# Patient Record
Sex: Male | Born: 1975 | Race: White | Hispanic: No | Marital: Married | Smoking: Current some day smoker
Health system: Southern US, Community
[De-identification: ages and names within clinical notes are randomized; demographics above are authoritative.]

## PROBLEM LIST (undated history)

## (undated) DIAGNOSIS — N200 Calculus of kidney: Secondary | ICD-10-CM

## (undated) HISTORY — PX: KNEE ARTHROSCOPY: SUR90

## (undated) HISTORY — PX: VARICOCELECTOMY: SHX1084

## (undated) HISTORY — PX: HEEL DECUBITUS ULCER EXCISION: SUR510

## (undated) HISTORY — PX: FOOT SURGERY: SHX648

---

## 2001-03-03 ENCOUNTER — Encounter: Payer: Self-pay | Admitting: Internal Medicine

## 2001-03-03 ENCOUNTER — Emergency Department (HOSPITAL_COMMUNITY): Admission: EM | Admit: 2001-03-03 | Discharge: 2001-03-03 | Payer: Self-pay | Admitting: Internal Medicine

## 2003-06-18 ENCOUNTER — Encounter (HOSPITAL_COMMUNITY): Admission: RE | Admit: 2003-06-18 | Discharge: 2003-07-18 | Payer: Self-pay | Admitting: Oncology

## 2003-06-18 ENCOUNTER — Encounter: Admission: RE | Admit: 2003-06-18 | Discharge: 2003-06-18 | Payer: Self-pay | Admitting: Oncology

## 2003-08-18 ENCOUNTER — Encounter (HOSPITAL_COMMUNITY): Admission: RE | Admit: 2003-08-18 | Discharge: 2003-09-17 | Payer: Self-pay | Admitting: Oncology

## 2003-08-18 ENCOUNTER — Encounter: Admission: RE | Admit: 2003-08-18 | Discharge: 2003-08-18 | Payer: Self-pay | Admitting: Oncology

## 2003-09-22 ENCOUNTER — Encounter: Admission: RE | Admit: 2003-09-22 | Discharge: 2003-09-22 | Payer: Self-pay | Admitting: Oncology

## 2003-09-22 ENCOUNTER — Encounter (HOSPITAL_COMMUNITY): Admission: RE | Admit: 2003-09-22 | Discharge: 2003-10-22 | Payer: Self-pay | Admitting: Oncology

## 2003-11-04 ENCOUNTER — Encounter: Admission: RE | Admit: 2003-11-04 | Discharge: 2003-11-04 | Payer: Self-pay | Admitting: Oncology

## 2003-11-04 ENCOUNTER — Encounter (HOSPITAL_COMMUNITY): Admission: RE | Admit: 2003-11-04 | Discharge: 2003-12-04 | Payer: Self-pay | Admitting: Oncology

## 2006-04-03 ENCOUNTER — Ambulatory Visit (HOSPITAL_COMMUNITY): Admission: RE | Admit: 2006-04-03 | Discharge: 2006-04-03 | Payer: Self-pay | Admitting: Sports Medicine

## 2007-04-19 ENCOUNTER — Emergency Department (HOSPITAL_COMMUNITY): Admission: EM | Admit: 2007-04-19 | Discharge: 2007-04-19 | Payer: Self-pay | Admitting: Emergency Medicine

## 2010-09-13 ENCOUNTER — Encounter: Payer: Self-pay | Admitting: Orthopaedic Surgery

## 2013-06-10 ENCOUNTER — Ambulatory Visit (HOSPITAL_COMMUNITY)
Admission: RE | Admit: 2013-06-10 | Discharge: 2013-06-10 | Disposition: A | Discharge status: Home / Self Care | Payer: BC Managed Care – PPO | Source: Ambulatory Visit | Attending: Family Medicine | Admitting: Family Medicine

## 2013-06-10 ENCOUNTER — Other Ambulatory Visit (HOSPITAL_COMMUNITY): Payer: Self-pay | Admitting: Family Medicine

## 2013-06-10 DIAGNOSIS — M79609 Pain in unspecified limb: Secondary | ICD-10-CM

## 2015-12-07 ENCOUNTER — Ambulatory Visit (INDEPENDENT_AMBULATORY_CARE_PROVIDER_SITE_OTHER): Payer: BLUE CROSS/BLUE SHIELD | Admitting: Family Medicine

## 2015-12-07 ENCOUNTER — Encounter: Payer: Self-pay | Admitting: Family Medicine

## 2015-12-07 VITALS — BP 120/60 | HR 74 | Temp 97.6°F | Resp 14 | Ht 71.0 in | Wt 156.0 lb

## 2015-12-07 DIAGNOSIS — Z0000 Encounter for general adult medical examination without abnormal findings: Secondary | ICD-10-CM

## 2015-12-07 NOTE — Progress Notes (Signed)
Subjective:    Patient ID: Wesley Middleton, male    DOB: 11/03/1975, 40 y.o.   MRN: [id number]  HPI The patient is here today to establish care. He has no medical concerns. He states that overall his health is good. He smokes 1 cigarette a day. However he does chew tobacco and dips snuff. Family history is significant for mother who died from colon cancer at 565. He believes she was diagnosed at 5262. He denies any blood in his stool. He denies any black tarry stool. He denies any abdominal pain. He denies any family history of prostate cancer No past medical history on file. Past Surgical History  Procedure Laterality Date  . Varicocelectomy      left testicle   No current outpatient prescriptions on file prior to visit.   No current facility-administered medications on file prior to visit.   Allergies  Allergen Reactions  . Ceclor [Cefaclor] Other (See Comments)    Pt had reaction as a child  . Penicillins Other (See Comments)    Had reaction as child   Social History   Social History  . Marital Status: Married    Spouse Name: N/A  . Number of Children: N/A  . Years of Education: N/A   Occupational History  . Not on file.   Social History Main Topics  . Smoking status: Current Some Day Smoker    Types: Cigarettes  . Smokeless tobacco: Current User    Types: Snuff  . Alcohol Use: 0.0 oz/week    0 Standard drinks or equivalent per week     Comment: Occasionally  . Drug Use: No  . Sexual Activity: Not on file   Other Topics Concern  . Not on file   Social History Narrative  . No narrative on file   Family History  Problem Relation Age of Onset  . Cancer Mother     colon (65)  . Stroke Paternal Grandfather       Review of Systems  All other systems reviewed and are negative.      Objective:   Physical Exam  Constitutional: He is oriented to person, place, and time. He appears well-developed and well-nourished. No distress.  HENT:  Head:  Normocephalic and atraumatic.  Right Ear: External ear normal.  Nose: Nose normal.  Mouth/Throat: Oropharynx is clear and moist. No oropharyngeal exudate.  Eyes: Conjunctivae and EOM are normal. Pupils are equal, round, and reactive to light. Right eye exhibits no discharge. Left eye exhibits no discharge. No scleral icterus.  Neck: Normal range of motion. Neck supple. No JVD present. No tracheal deviation present. No thyromegaly present.  Cardiovascular: Normal rate, regular rhythm, normal heart sounds and intact distal pulses.  Exam reveals no gallop and no friction rub.   No murmur heard. Pulmonary/Chest: Effort normal and breath sounds normal. No stridor. No respiratory distress. He has no wheezes. He has no rales. He exhibits no tenderness.  Abdominal: Soft. Bowel sounds are normal. He exhibits no distension and no mass. There is no tenderness. There is no rebound and no guarding. Hernia confirmed negative in the right inguinal area and confirmed negative in the left inguinal area.  Genitourinary: Testes normal and penis normal. Right testis shows no mass and no tenderness. Left testis shows no mass and no tenderness.  Musculoskeletal: Normal range of motion. He exhibits no edema or tenderness.  Lymphadenopathy:    He has no cervical adenopathy.       Right: No inguinal adenopathy  present.       Left: No inguinal adenopathy present.  Neurological: He is alert and oriented to person, place, and time. He has normal reflexes. He displays normal reflexes. No cranial nerve deficit. He exhibits normal muscle tone. Coordination normal.  Skin: Skin is warm. No rash noted. He is not diaphoretic. No erythema. No pallor.  Psychiatric: He has a normal mood and affect. His behavior is normal. Judgment and thought content normal.  Vitals reviewed.         Assessment & Plan:  Routine general medical examination at a health care facility - Plan: CBC with Differential/Platelet, COMPLETE METABOLIC  PANEL WITH GFR, Lipid panel  Exam today is normal. I recommended smoking cessation and avoidance of oral tobacco. I would like him to return fasting for a CBC, CMP, fasting lipid panel. He'll be due for colonoscopy 10 years before his family member was diagnosed or age 68 which ever comes first. He is not yet due for prostate cancer screening. His blood pressures excellent. Tetanus shot is up-to-date.

## 2015-12-08 ENCOUNTER — Other Ambulatory Visit: Payer: BLUE CROSS/BLUE SHIELD

## 2015-12-08 DIAGNOSIS — Z0000 Encounter for general adult medical examination without abnormal findings: Secondary | ICD-10-CM | POA: Diagnosis not present

## 2015-12-08 LAB — COMPLETE METABOLIC PANEL WITH GFR
ALBUMIN: 4.1 g/dL (ref 3.6–5.1)
ALK PHOS: 57 U/L (ref 40–115)
ALT: 13 U/L (ref 9–46)
AST: 17 U/L (ref 10–40)
BILIRUBIN TOTAL: 0.4 mg/dL (ref 0.2–1.2)
BUN: 12 mg/dL (ref 7–25)
CO2: 24 mmol/L (ref 20–31)
Calcium: 9.1 mg/dL (ref 8.6–10.3)
Chloride: 104 mmol/L (ref 98–110)
Creat: 0.9 mg/dL (ref 0.60–1.35)
GLUCOSE: 89 mg/dL (ref 70–99)
POTASSIUM: 4.3 mmol/L (ref 3.5–5.3)
SODIUM: 140 mmol/L (ref 135–146)
TOTAL PROTEIN: 6.5 g/dL (ref 6.1–8.1)

## 2015-12-08 LAB — LIPID PANEL
CHOL/HDL RATIO: 2.6 ratio (ref <=5.0)
Cholesterol: 129 mg/dL (ref 125–200)
HDL: 50 mg/dL (ref >=40)
LDL Cholesterol: 70 mg/dL (ref <130)
TRIGLYCERIDES: 46 mg/dL (ref <150)
VLDL: 9 mg/dL (ref <30)

## 2015-12-08 LAB — CBC WITH DIFFERENTIAL/PLATELET
Basophils Absolute: 54 cells/uL (ref 0–200)
Basophils Relative: 1 %
Eosinophils Absolute: 108 cells/uL (ref 15–500)
Eosinophils Relative: 2 %
HCT: 44.6 % (ref 38.5–50.0)
Hemoglobin: 14.6 g/dL (ref 13.0–17.0)
Lymphocytes Relative: 36 %
Lymphs Abs: 1944 cells/uL (ref 850–3900)
MCH: 30.3 pg (ref 27.0–33.0)
MCHC: 32.7 g/dL (ref 32.0–36.0)
MCV: 92.5 fL (ref 80.0–100.0)
Monocytes Absolute: 594 cells/uL (ref 200–950)
Monocytes Relative: 11 %
Neutro Abs: 2700 cells/uL (ref 1500–7800)
Neutrophils Relative %: 50 %
Platelets: 143 10*3/uL (ref 140–400)
RBC: 4.82 MIL/uL (ref 4.20–5.80)
RDW: 13.6 % (ref 11.0–15.0)
WBC: 5.4 10*3/uL (ref 3.8–10.8)

## 2015-12-10 ENCOUNTER — Encounter: Payer: Self-pay | Admitting: *Deleted

## 2015-12-15 ENCOUNTER — Encounter: Payer: Self-pay | Admitting: Family Medicine

## 2016-01-19 ENCOUNTER — Ambulatory Visit (INDEPENDENT_AMBULATORY_CARE_PROVIDER_SITE_OTHER): Payer: BLUE CROSS/BLUE SHIELD | Admitting: Family Medicine

## 2016-01-19 ENCOUNTER — Encounter: Payer: Self-pay | Admitting: Family Medicine

## 2016-01-19 VITALS — BP 100/70 | HR 72 | Temp 97.7°F | Resp 18 | Ht 71.0 in | Wt 158.0 lb

## 2016-01-19 DIAGNOSIS — A6920 Lyme disease, unspecified: Secondary | ICD-10-CM

## 2016-01-19 MED ORDER — DOXYCYCLINE HYCLATE 100 MG PO TABS: 100.0000 mg | ORAL_TABLET | Freq: Two times a day (BID) | ORAL | Status: DC

## 2016-01-19 NOTE — Progress Notes (Signed)
   Subjective:    Patient ID: Wesley Middleton, male    DOB: 05/24/1976, 40 y.o.   MRN: [id number]  HPI 7-10 days ago, the patient found a tick feeding on his skin over the left inguinal canal. Tick was removed but it had been attached for at least a day. Patient gave no further thought.  However over the weekend, he started developing a spreading red ring with central clearing. The ring is approximately 8 cm x 6 cm. The center is clear with tick had bitten him. It appears to be erythema migrans. Otherwise he is asymptomatic No past medical history on file. Past Surgical History  Procedure Laterality Date  . Varicocelectomy      left testicle   No current outpatient prescriptions on file prior to visit.   No current facility-administered medications on file prior to visit.   Allergies  Allergen Reactions  . Ceclor [Cefaclor] Other (See Comments)    Pt had reaction as a child  . Penicillins Other (See Comments)    Had reaction as child   Social History   Social History  . Marital Status: Married    Spouse Name: N/A  . Number of Children: N/A  . Years of Education: N/A   Occupational History  . Not on file.   Social History Main Topics  . Smoking status: Current Some Day Smoker    Types: Cigarettes  . Smokeless tobacco: Current User    Types: Snuff  . Alcohol Use: 0.0 oz/week    0 Standard drinks or equivalent per week     Comment: Occasionally  . Drug Use: No  . Sexual Activity: Not on file   Other Topics Concern  . Not on file   Social History Narrative      Review of Systems  All other systems reviewed and are negative.      Objective:   Physical Exam  Cardiovascular: Normal rate, regular rhythm and normal heart sounds.   Pulmonary/Chest: Effort normal and breath sounds normal. No respiratory distress. He has no wheezes. He has no rales.  Skin: Rash noted. There is erythema.  Vitals reviewed.         Assessment & Plan:  Erythema migrans (Lyme  disease) - Plan: doxycycline (VIBRA-TABS) 100 MG tablet  Begin doxycycline 100 mg by mouth twice a day for 21 days. Given the fact that he has had the condition for at least 7-10 days down the side of caution and treat longer than 14 days to ensure complete resolution.

## 2016-01-28 ENCOUNTER — Telehealth: Payer: Self-pay | Admitting: Family Medicine

## 2016-01-28 MED ORDER — PANTOPRAZOLE SODIUM 40 MG PO TBEC: 40.0000 mg | DELAYED_RELEASE_TABLET | Freq: Every day | ORAL | Status: DC

## 2016-01-28 NOTE — Telephone Encounter (Signed)
Start protonix 40 mg poqday and call me if no better.

## 2016-01-28 NOTE — Telephone Encounter (Signed)
Pt's wife called and said that he has been having severe heartburn over the past 2 days. He has not been able to eat much of anything due to the pain. He believes that it may be coming from the Doxycycline. He is requesting advice on anything he may be able to take to help relieve it.  [phone]

## 2016-01-28 NOTE — Telephone Encounter (Signed)
Call placed to patient and patient made aware.   Prescription sent to pharmacy.  

## 2016-03-24 ENCOUNTER — Other Ambulatory Visit: Payer: Self-pay | Admitting: Family Medicine

## 2016-03-24 ENCOUNTER — Other Ambulatory Visit: Payer: Self-pay | Admitting: *Deleted

## 2016-03-24 ENCOUNTER — Ambulatory Visit (HOSPITAL_COMMUNITY)
Admission: RE | Admit: 2016-03-24 | Discharge: 2016-03-24 | Disposition: A | Discharge status: Home / Self Care | Payer: BLUE CROSS/BLUE SHIELD | Source: Ambulatory Visit | Attending: Family Medicine | Admitting: Family Medicine

## 2016-03-24 ENCOUNTER — Ambulatory Visit (INDEPENDENT_AMBULATORY_CARE_PROVIDER_SITE_OTHER): Payer: BLUE CROSS/BLUE SHIELD | Admitting: Family Medicine

## 2016-03-24 ENCOUNTER — Encounter: Payer: Self-pay | Admitting: Family Medicine

## 2016-03-24 VITALS — BP 110/80 | HR 62 | Temp 97.6°F | Resp 14 | Ht 71.0 in | Wt 157.0 lb

## 2016-03-24 DIAGNOSIS — M25469 Effusion, unspecified knee: Secondary | ICD-10-CM

## 2016-03-24 DIAGNOSIS — M25562 Pain in left knee: Secondary | ICD-10-CM

## 2016-03-24 DIAGNOSIS — M1712 Unilateral primary osteoarthritis, left knee: Secondary | ICD-10-CM | POA: Insufficient documentation

## 2016-03-24 DIAGNOSIS — M179 Osteoarthritis of knee, unspecified: Secondary | ICD-10-CM | POA: Diagnosis not present

## 2016-03-24 NOTE — Progress Notes (Signed)
   Subjective:    Patient ID: Wesley Middleton, male    DOB: [birth year], 40 y.o.   MRN: [id number]  HPI  Has been Dealing with left knee pain, posterior on and off for more than a year. He has hurt the knee twice going up but he never had any testing done to figure out what the injury was. Now he reports periodic knee effusions. In the past doctors have "drawn fluid off of that knee" on 2 separate occasions. He reports sharp pain in the posterior aspect of the knee joint. He reports episodic tightness and swelling that limit his flexion in the knee joint. After he is off his knee for a few days the pain goes away but after working on his knee for 1 or 2 days the pain returns. He is tried over-the-counter NSAIDs with minimal relief No past medical history on file. Past Surgical History:  Procedure Laterality Date  . VARICOCELECTOMY     left testicle   Current Outpatient Prescriptions on File Prior to Visit  Medication Sig Dispense Refill  . doxycycline (VIBRA-TABS) 100 MG tablet Take 1 tablet (100 mg total) by mouth 2 (two) times daily. 42 tablet 0  . pantoprazole (PROTONIX) 40 MG tablet Take 1 tablet (40 mg total) by mouth daily. 30 tablet 3   No current facility-administered medications on file prior to visit.    Allergies  Allergen Reactions  . Ceclor [Cefaclor] Other (See Comments)    Pt had reaction as a child  . Penicillins Other (See Comments)    Had reaction as child   Social History   Social History  . Marital status: Married    Spouse name: N/A  . Number of children: N/A  . Years of education: N/A   Occupational History  . Not on file.   Social History Main Topics  . Smoking status: Current Some Day Smoker    Types: Cigarettes  . Smokeless tobacco: Current User    Types: Snuff  . Alcohol use 0.0 oz/week     Comment: Occasionally  . Drug use: No  . Sexual activity: Not on file   Other Topics Concern  . Not on file   Social History Narrative  . No  narrative on file   Family History  Problem Relation Age of Onset  . Cancer Mother     colon (65)  . Stroke Paternal Grandfather       Review of Systems  All other systems reviewed and are negative.      Objective:   Physical Exam  Cardiovascular: Normal rate, regular rhythm and normal heart sounds.   Pulmonary/Chest: Effort normal and breath sounds normal.  Musculoskeletal:       Left knee: He exhibits normal range of motion, no swelling, no effusion and normal meniscus. No tenderness found. No MCL and no LCL tenderness noted.  Vitals reviewed.         Assessment & Plan:  Posterior left knee pain - Plan: DG Knee Complete 4 Views Left  I suspect the patient has suffered an meniscus tear in the past and may be dealing with a Baker's cyst from time to time area begin x-ray of the knee. If x-ray is normal as I suspect, proceed with an MRI to determine if surgical intervention is necessary

## 2016-04-11 ENCOUNTER — Ambulatory Visit (HOSPITAL_COMMUNITY): Payer: BLUE CROSS/BLUE SHIELD

## 2016-05-23 ENCOUNTER — Telehealth: Payer: Self-pay | Admitting: Family Medicine

## 2016-05-23 ENCOUNTER — Other Ambulatory Visit: Payer: Self-pay | Admitting: Family Medicine

## 2016-05-23 ENCOUNTER — Encounter: Payer: Self-pay | Admitting: Family Medicine

## 2016-05-23 DIAGNOSIS — M25562 Pain in left knee: Secondary | ICD-10-CM

## 2016-05-23 NOTE — Telephone Encounter (Signed)
Pt has MRI of knee in August approved and scheduled.  He never had done due to deductible.  Now wants us to reschedule.  Insurance is denying.  Per PCP refer to Ortho for eval.  Pt requests to see [facility].

## 2016-05-24 DIAGNOSIS — M25562 Pain in left knee: Secondary | ICD-10-CM | POA: Diagnosis not present

## 2016-06-01 DIAGNOSIS — M25562 Pain in left knee: Secondary | ICD-10-CM | POA: Diagnosis not present

## 2016-06-07 DIAGNOSIS — M25562 Pain in left knee: Secondary | ICD-10-CM | POA: Diagnosis not present

## 2016-06-22 DIAGNOSIS — G8918 Other acute postprocedural pain: Secondary | ICD-10-CM | POA: Diagnosis not present

## 2016-06-22 DIAGNOSIS — S83512A Sprain of anterior cruciate ligament of left knee, initial encounter: Secondary | ICD-10-CM | POA: Diagnosis not present

## 2016-06-22 DIAGNOSIS — M23252 Derangement of posterior horn of lateral meniscus due to old tear or injury, left knee: Secondary | ICD-10-CM | POA: Diagnosis not present

## 2016-06-22 DIAGNOSIS — S83242A Other tear of medial meniscus, current injury, left knee, initial encounter: Secondary | ICD-10-CM | POA: Diagnosis not present

## 2016-06-30 DIAGNOSIS — S83242D Other tear of medial meniscus, current injury, left knee, subsequent encounter: Secondary | ICD-10-CM | POA: Diagnosis not present

## 2016-07-11 DIAGNOSIS — S83242D Other tear of medial meniscus, current injury, left knee, subsequent encounter: Secondary | ICD-10-CM | POA: Diagnosis not present

## 2016-08-10 DIAGNOSIS — M545 Low back pain: Secondary | ICD-10-CM | POA: Diagnosis not present

## 2016-08-10 DIAGNOSIS — M9902 Segmental and somatic dysfunction of thoracic region: Secondary | ICD-10-CM | POA: Diagnosis not present

## 2016-08-10 DIAGNOSIS — M9903 Segmental and somatic dysfunction of lumbar region: Secondary | ICD-10-CM | POA: Diagnosis not present

## 2016-08-10 DIAGNOSIS — M546 Pain in thoracic spine: Secondary | ICD-10-CM | POA: Diagnosis not present

## 2016-08-16 DIAGNOSIS — S83242D Other tear of medial meniscus, current injury, left knee, subsequent encounter: Secondary | ICD-10-CM | POA: Diagnosis not present

## 2016-08-17 ENCOUNTER — Encounter: Payer: Self-pay | Admitting: Family Medicine

## 2016-08-17 ENCOUNTER — Ambulatory Visit (INDEPENDENT_AMBULATORY_CARE_PROVIDER_SITE_OTHER): Payer: BLUE CROSS/BLUE SHIELD | Admitting: Family Medicine

## 2016-08-17 VITALS — BP 118/70 | HR 80 | Temp 98.6°F | Resp 16 | Ht 71.0 in | Wt 163.0 lb

## 2016-08-17 DIAGNOSIS — J111 Influenza due to unidentified influenza virus with other respiratory manifestations: Secondary | ICD-10-CM

## 2016-08-17 MED ORDER — OSELTAMIVIR PHOSPHATE 75 MG PO CAPS
75.0000 mg | ORAL_CAPSULE | Freq: Two times a day (BID) | ORAL | 0 refills | Status: DC
Start: 2016-08-17 — End: 2017-10-13

## 2016-08-17 NOTE — Progress Notes (Signed)
   Subjective:    Patient ID: Wesley Middleton, male    DOB: 07/31/1976, 40 y.o.   MRN: [id number]  HPI Symptoms began 2 days ago. Symptoms consist of diffuse severe body aches, low-grade fever to 101, rhinorrhea, scratchy throat, dull headache, and nonproductive cough. No past medical history on file. Past Surgical History:  Procedure Laterality Date  . VARICOCELECTOMY     left testicle   No current outpatient prescriptions on file prior to visit.   No current facility-administered medications on file prior to visit.    Allergies  Allergen Reactions  . Ceclor [Cefaclor] Other (See Comments)    Pt had reaction as a child  . Penicillins Other (See Comments)    Had reaction as child   Social History   Social History  . Marital status: Married    Spouse name: N/A  . Number of children: N/A  . Years of education: N/A   Occupational History  . Not on file.   Social History Main Topics  . Smoking status: Current Some Day Smoker    Types: Cigarettes  . Smokeless tobacco: Current User    Types: Snuff  . Alcohol use 0.0 oz/week     Comment: Occasionally  . Drug use: No  . Sexual activity: Not on file   Other Topics Concern  . Not on file   Social History Narrative  . No narrative on file      Review of Systems  All other systems reviewed and are negative.      Objective:   Physical Exam  Constitutional: He appears well-developed and well-nourished. No distress.  HENT:  Right Ear: External ear normal.  Left Ear: External ear normal.  Nose: Mucosal edema and rhinorrhea present.  Mouth/Throat: Oropharynx is clear and moist. No oropharyngeal exudate.  Eyes: Conjunctivae are normal.  Neck: Neck supple.  Cardiovascular: Normal rate, regular rhythm and normal heart sounds.   No murmur heard. Pulmonary/Chest: Effort normal and breath sounds normal. No respiratory distress. He has no wheezes. He has no rales.  Lymphadenopathy:    He has no cervical adenopathy.   Skin: He is not diaphoretic.  Vitals reviewed.         Assessment & Plan:  Influenza with respiratory manifestation - Plan: oseltamivir (TAMIFLU) 75 MG capsule  History and symptoms are consistent with influenza. Begin Tamiflu 75 mg by mouth twice a day for 5 days. Use ibuprofen for symptomatic relief. Recheck in one week if no better or sooner if worse

## 2016-08-24 DIAGNOSIS — M9902 Segmental and somatic dysfunction of thoracic region: Secondary | ICD-10-CM | POA: Diagnosis not present

## 2016-08-24 DIAGNOSIS — M545 Low back pain: Secondary | ICD-10-CM | POA: Diagnosis not present

## 2016-08-24 DIAGNOSIS — M546 Pain in thoracic spine: Secondary | ICD-10-CM | POA: Diagnosis not present

## 2016-08-24 DIAGNOSIS — M9903 Segmental and somatic dysfunction of lumbar region: Secondary | ICD-10-CM | POA: Diagnosis not present

## 2017-06-16 DIAGNOSIS — M9902 Segmental and somatic dysfunction of thoracic region: Secondary | ICD-10-CM | POA: Diagnosis not present

## 2017-06-16 DIAGNOSIS — M545 Low back pain: Secondary | ICD-10-CM | POA: Diagnosis not present

## 2017-06-16 DIAGNOSIS — M546 Pain in thoracic spine: Secondary | ICD-10-CM | POA: Diagnosis not present

## 2017-06-16 DIAGNOSIS — M9903 Segmental and somatic dysfunction of lumbar region: Secondary | ICD-10-CM | POA: Diagnosis not present

## 2017-09-22 DIAGNOSIS — M9902 Segmental and somatic dysfunction of thoracic region: Secondary | ICD-10-CM | POA: Diagnosis not present

## 2017-09-22 DIAGNOSIS — M9903 Segmental and somatic dysfunction of lumbar region: Secondary | ICD-10-CM | POA: Diagnosis not present

## 2017-09-22 DIAGNOSIS — M546 Pain in thoracic spine: Secondary | ICD-10-CM | POA: Diagnosis not present

## 2017-09-22 DIAGNOSIS — M545 Low back pain: Secondary | ICD-10-CM | POA: Diagnosis not present

## 2017-09-28 DIAGNOSIS — M9902 Segmental and somatic dysfunction of thoracic region: Secondary | ICD-10-CM | POA: Diagnosis not present

## 2017-09-28 DIAGNOSIS — M9903 Segmental and somatic dysfunction of lumbar region: Secondary | ICD-10-CM | POA: Diagnosis not present

## 2017-09-28 DIAGNOSIS — M545 Low back pain: Secondary | ICD-10-CM | POA: Diagnosis not present

## 2017-09-28 DIAGNOSIS — M546 Pain in thoracic spine: Secondary | ICD-10-CM | POA: Diagnosis not present

## 2017-10-13 ENCOUNTER — Encounter: Payer: Self-pay | Admitting: Family Medicine

## 2017-10-13 ENCOUNTER — Ambulatory Visit: Payer: BLUE CROSS/BLUE SHIELD | Admitting: Family Medicine

## 2017-10-13 VITALS — BP 132/78 | HR 58 | Temp 97.4°F | Resp 14 | Ht 71.0 in | Wt 161.0 lb

## 2017-10-13 DIAGNOSIS — S39012A Strain of muscle, fascia and tendon of lower back, initial encounter: Secondary | ICD-10-CM | POA: Diagnosis not present

## 2017-10-13 MED ORDER — MELOXICAM 15 MG PO TABS: 15.0000 mg | ORAL_TABLET | Freq: Every day | ORAL | 0 refills | Status: DC

## 2017-10-13 MED ORDER — PREDNISONE 20 MG PO TABS: ORAL_TABLET | ORAL | 0 refills | Status: DC

## 2017-10-13 MED ORDER — CYCLOBENZAPRINE HCL 10 MG PO TABS: 10.0000 mg | ORAL_TABLET | Freq: Three times a day (TID) | ORAL | 0 refills | Status: DC | PRN
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
## 2017-11-03 IMAGING — DX DG KNEE COMPLETE 4+V*L*
4 series · 4 of 4 positions shown · non-contrast
Comparison: None ; correlation MRI RIGHT knee 04/03/2006

CLINICAL DATA: Posterior LEFT knee pain for couple weeks, remote
injury, unable to bend knee all the way

EXAM:
LEFT KNEE - COMPLETE 4+ VIEW

[knee ap]
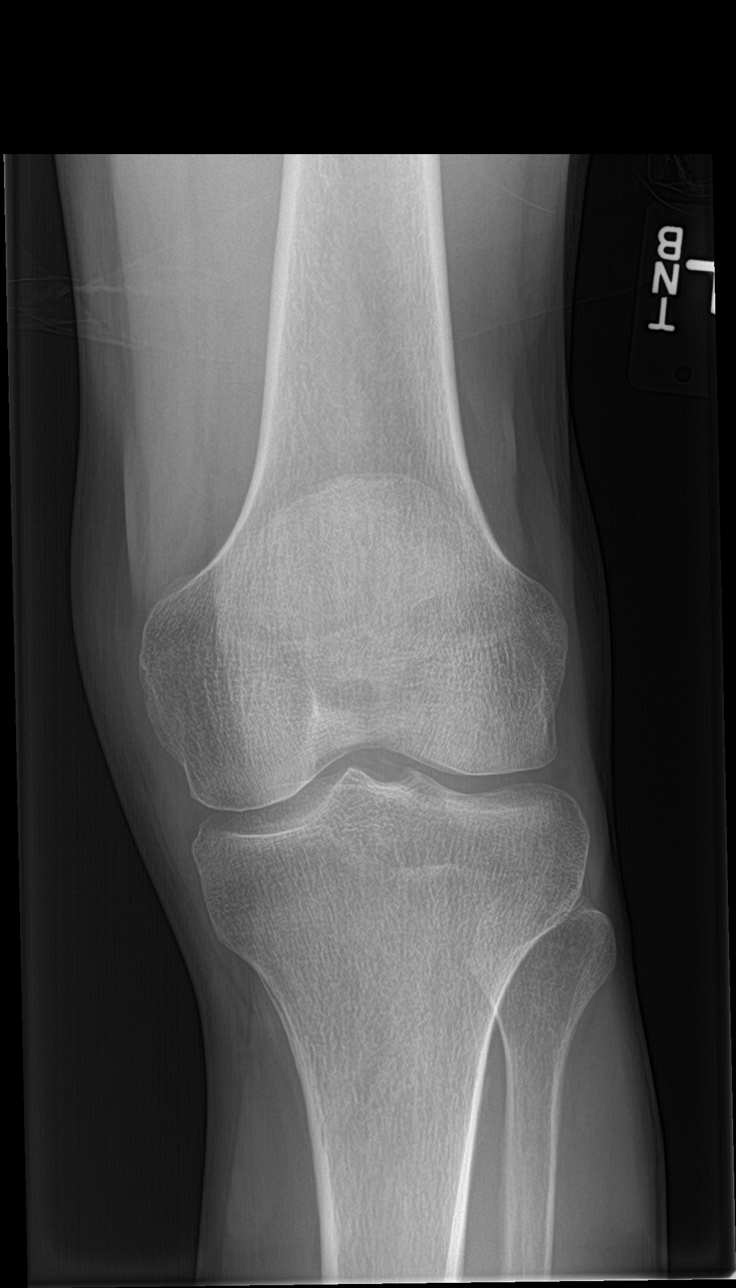

[tunnel]
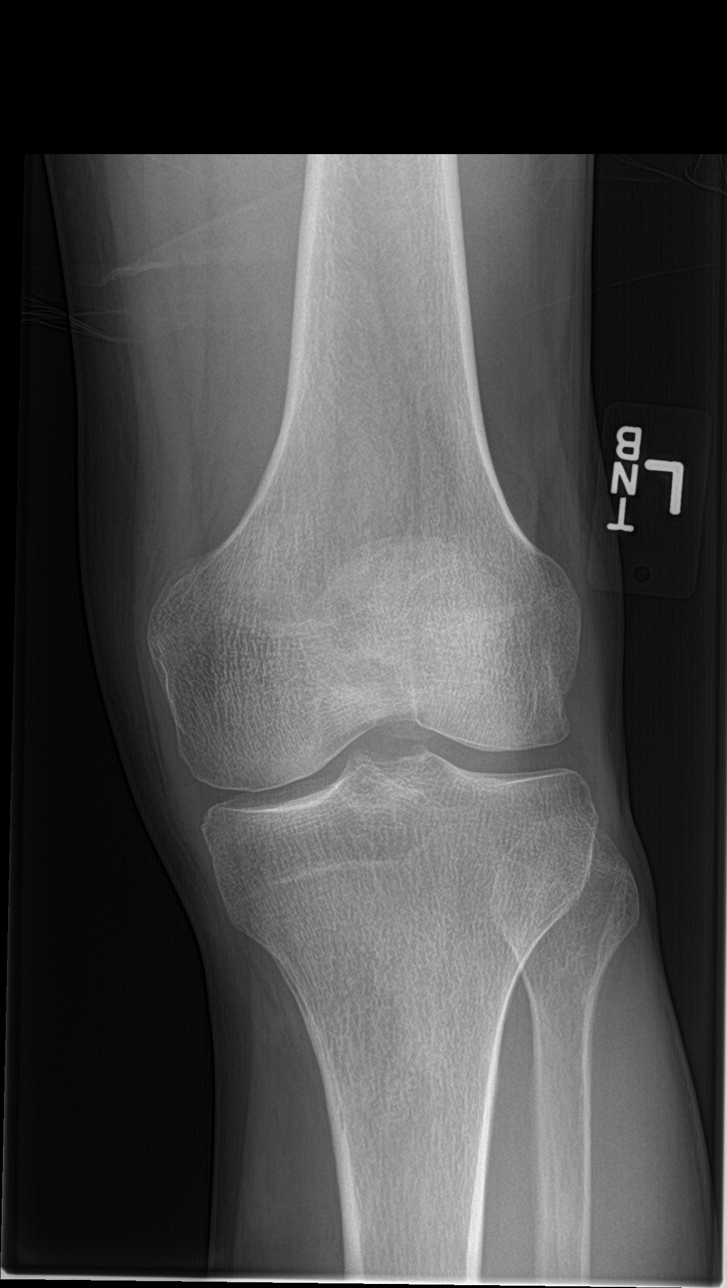

[knee lat]
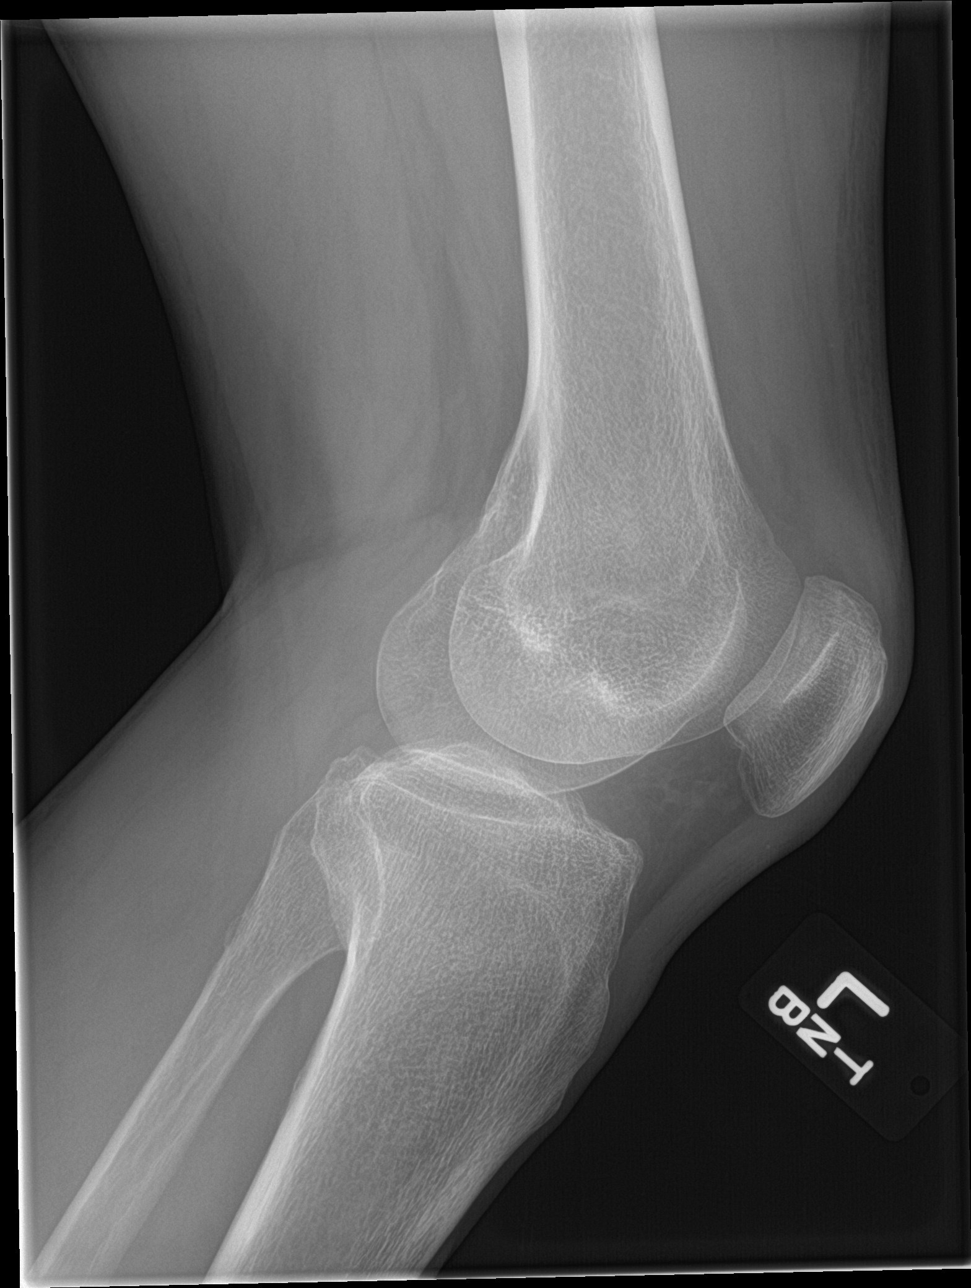

[knee sunrise]
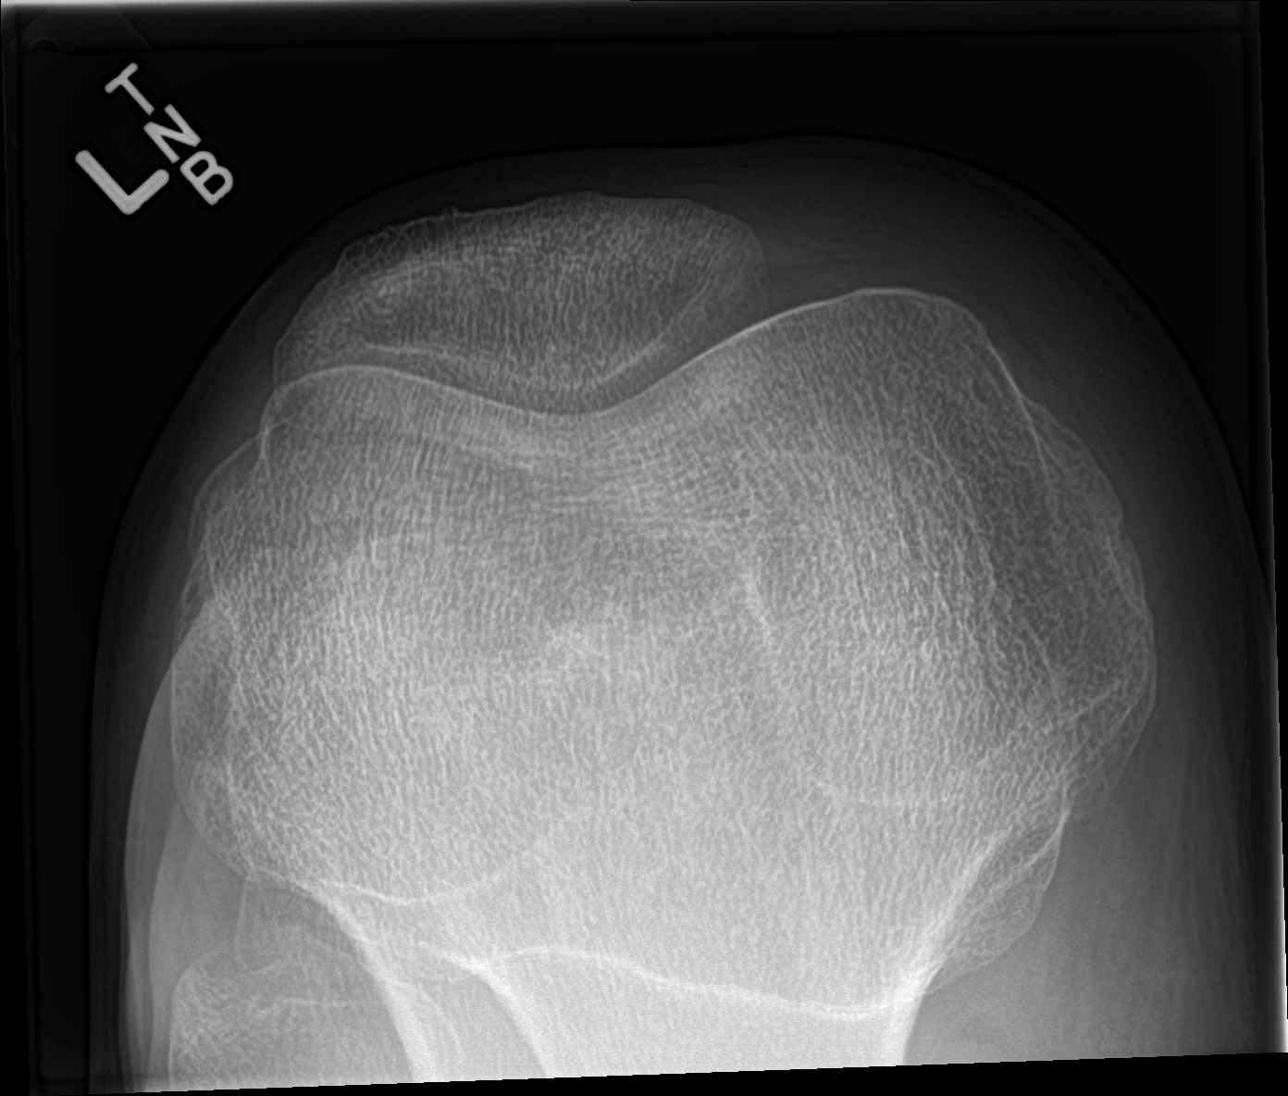

[4 of 4 positions shown; findings below may reference images not displayed]

FINDINGS: Osseous mineralization low normal for technique.

Mild medial compartment joint space narrowing.

Remaining joint spaces preserved.

No acute fracture, dislocation, or bone destruction.

No knee joint effusion.
IMPRESSION: Minimal degenerative changes without acute bony abnormality.

## 2018-04-09 ENCOUNTER — Encounter: Payer: Self-pay | Admitting: Family Medicine

## 2018-04-09 ENCOUNTER — Ambulatory Visit: Payer: BLUE CROSS/BLUE SHIELD | Admitting: Family Medicine

## 2018-04-09 VITALS — BP 110/68 | HR 62 | Temp 98.0°F | Resp 16 | Ht 71.0 in | Wt 158.0 lb

## 2018-04-09 DIAGNOSIS — B9789 Other viral agents as the cause of diseases classified elsewhere: Secondary | ICD-10-CM

## 2018-04-09 DIAGNOSIS — J069 Acute upper respiratory infection, unspecified: Secondary | ICD-10-CM

## 2018-04-09 MED ORDER — FLUTICASONE PROPIONATE 50 MCG/ACT NA SUSP: 2.0000 | Freq: Every day | NASAL | 6 refills | Status: DC

## 2018-04-09 MED ORDER — HYDROCODONE-HOMATROPINE 5-1.5 MG/5ML PO SYRP: 5.0000 mL | ORAL_SOLUTION | Freq: Three times a day (TID) | ORAL | 0 refills | Status: DC | PRN

## 2018-04-09 MED ORDER — LEVOCETIRIZINE DIHYDROCHLORIDE 5 MG PO TABS: 5.0000 mg | ORAL_TABLET | Freq: Every evening | ORAL | 0 refills | Status: DC

## 2018-04-09 NOTE — Progress Notes (Signed)
Subjective:    Patient ID: Wesley Middleton, male    DOB: 08/13/1976, 42 y.o.   MRN: [id number]  HPI Patient symptoms started roughly 1 week ago.  Symptoms include postnasal drip, sore scratchy throat, rhinorrhea, nonproductive cough.  He denies any fevers or chills.  He denies any shortness of breath.  He denies any headache or sinus pain.  He denies any otalgia.  He denies any chest pain or pleurisy.  He denies any muscle aches No past medical history on file.  No current outpatient medications on file prior to visit.   No current facility-administered medications on file prior to visit.    Allergies  Allergen Reactions  . Ceclor [Cefaclor] Other (See Comments)    Pt had reaction as a child  . Penicillins Other (See Comments)    Had reaction as child   Social History   Socioeconomic History  . Marital status: Married    Spouse name: Not on file  . Number of children: Not on file  . Years of education: Not on file  . Highest education level: Not on file  Occupational History  . Not on file  Social Needs  . Financial resource strain: Not on file  . Food insecurity:    Worry: Not on file    Inability: Not on file  . Transportation needs:    Medical: Not on file    Non-medical: Not on file  Tobacco Use  . Smoking status: Current Some Day Smoker    Types: Cigarettes  . Smokeless tobacco: Current User    Types: Snuff  Substance and Sexual Activity  . Alcohol use: Yes    Alcohol/week: 0.0 standard drinks    Comment: Occasionally  . Drug use: No  . Sexual activity: Not on file  Lifestyle  . Physical activity:    Days per week: Not on file    Minutes per session: Not on file  . Stress: Not on file  Relationships  . Social connections:    Talks on phone: Not on file    Gets together: Not on file    Attends religious service: Not on file    Active member of club or organization: Not on file    Attends meetings of clubs or organizations: Not on file   Relationship status: Not on file  . Intimate partner violence:    Fear of current or ex partner: Not on file    Emotionally abused: Not on file    Physically abused: Not on file    Forced sexual activity: Not on file  Other Topics Concern  . Not on file  Social History Narrative  . Not on file      Review of Systems  All other systems reviewed and are negative.      Objective:   Physical Exam  Constitutional: He appears well-developed and well-nourished. No distress.  HENT:  Right Ear: External ear normal.  Left Ear: External ear normal.  Nose: Mucosal edema and rhinorrhea present.  Mouth/Throat: Oropharynx is clear and moist. No oropharyngeal exudate.  Eyes: Conjunctivae are normal.  Neck: Neck supple.  Cardiovascular: Normal rate, regular rhythm and normal heart sounds.  No murmur heard. Pulmonary/Chest: Effort normal and breath sounds normal. No respiratory distress. He has no wheezes. He has no rales.  Lymphadenopathy:    He has no cervical adenopathy.  Skin: He is not diaphoretic.  Vitals reviewed.         Assessment & Plan:  Symptoms are  consistent with a viral upper respiratory infection versus sinusitis due to allergies.  I will treat both by using Xyzal 5 mg p.o. daily to decrease postnasal drip and rhinorrhea and allow tincture of time.  If no better in 2 to 3 days, he can add Flonase 2 sprays each nostril daily.  He can use Hycodan 1 teaspoon every 6 hours as needed for cough.  Allow 2 to 3 days and I believe symptoms will likely be improving spontaneously without medication.  I see no signs on his exam for serious bacterial illness

## 2018-07-03 ENCOUNTER — Encounter: Payer: Self-pay | Admitting: Family Medicine

## 2018-07-03 ENCOUNTER — Ambulatory Visit (INDEPENDENT_AMBULATORY_CARE_PROVIDER_SITE_OTHER): Payer: BLUE CROSS/BLUE SHIELD | Admitting: Family Medicine

## 2018-07-03 VITALS — BP 130/70 | HR 54 | Temp 97.5°F | Resp 16 | Ht 71.0 in | Wt 162.0 lb

## 2018-07-03 DIAGNOSIS — R358 Other polyuria: Secondary | ICD-10-CM

## 2018-07-03 DIAGNOSIS — Z23 Encounter for immunization: Secondary | ICD-10-CM

## 2018-07-03 DIAGNOSIS — R3589 Other polyuria: Secondary | ICD-10-CM

## 2018-07-03 DIAGNOSIS — R5382 Chronic fatigue, unspecified: Secondary | ICD-10-CM | POA: Diagnosis not present

## 2018-07-03 DIAGNOSIS — Z0000 Encounter for general adult medical examination without abnormal findings: Secondary | ICD-10-CM

## 2018-07-03 NOTE — Addendum Note (Signed)
Addended by: Legrand RamsWILLIS, SANDY B on: 07/03/2018 03:56 PM   Modules accepted: Orders

## 2018-07-03 NOTE — Progress Notes (Signed)
Subjective:    Patient ID: Wesley Middleton, male    DOB: 09/18/1975, 42 y.o.   MRN: [id number]  HPI The patient is here today for a CPE. Family history is significant for mother who died from colon cancer at 2165. He believes she was diagnosed at 5255. He denies any blood in his stool. He denies any black tarry stool. He denies any abdominal pain.  He denies any family history of prostate cancer  He does report fatigue.  He attributes this to working third shift.  He often is unable to sleep.  Is on his days off he only gets 2 hours because the abnormalities in his schedule.  He believes this is the most likely cause of his fatigue.  He denies any depression.  He denies any suicidal ideation.  He does report increased urinary frequency.  He also reports nocturia.  He denies urinary hesitancy, dysuria, hematuria.  He does report a slightly weaker stream.  Otherwise he is doing well.  He is due for a flu shot and a tetanus vaccine  Current Outpatient Medications on File Prior to Visit  Medication Sig Dispense Refill  . fluticasone (FLONASE) 50 MCG/ACT nasal spray Place 2 sprays into both nostrils daily. 16 g 6  . HYDROcodone-homatropine (HYCODAN) 5-1.5 MG/5ML syrup Take 5 mLs by mouth every 8 (eight) hours as needed for cough. 120 mL 0  . levocetirizine (XYZAL) 5 MG tablet Take 1 tablet (5 mg total) by mouth every evening. 30 tablet 0   No current facility-administered medications on file prior to visit.    Allergies  Allergen Reactions  . Ceclor [Cefaclor] Other (See Comments)    Pt had reaction as a child  . Penicillins Other (See Comments)    Had reaction as child   Social History   Socioeconomic History  . Marital status: Married    Spouse name: Not on file  . Number of children: Not on file  . Years of education: Not on file  . Highest education level: Not on file  Occupational History  . Not on file  Social Needs  . Financial resource strain: Not on file  . Food  insecurity:    Worry: Not on file    Inability: Not on file  . Transportation needs:    Medical: Not on file    Non-medical: Not on file  Tobacco Use  . Smoking status: Current Some Day Smoker    Types: Cigarettes  . Smokeless tobacco: Current User    Types: Snuff  Substance and Sexual Activity  . Alcohol use: Yes    Alcohol/week: 0.0 standard drinks    Comment: Occasionally  . Drug use: No  . Sexual activity: Not on file  Lifestyle  . Physical activity:    Days per week: Not on file    Minutes per session: Not on file  . Stress: Not on file  Relationships  . Social connections:    Talks on phone: Not on file    Gets together: Not on file    Attends religious service: Not on file    Active member of club or organization: Not on file    Attends meetings of clubs or organizations: Not on file    Relationship status: Not on file  . Intimate partner violence:    Fear of current or ex partner: Not on file    Emotionally abused: Not on file    Physically abused: Not on file    Forced sexual  activity: Not on file  Other Topics Concern  . Not on file  Social History Narrative  . Not on file   Family History  Problem Relation Age of Onset  . Cancer Mother        colon (65)  . Stroke Paternal Grandfather       Review of Systems  All other systems reviewed and are negative.      Objective:   Physical Exam  Constitutional: He is oriented to person, place, and time. He appears well-developed and well-nourished. No distress.  HENT:  Head: Normocephalic and atraumatic.  Right Ear: External ear normal.  Nose: Nose normal.  Mouth/Throat: Oropharynx is clear and moist. No oropharyngeal exudate.  Eyes: Pupils are equal, round, and reactive to light. Conjunctivae and EOM are normal. Right eye exhibits no discharge. Left eye exhibits no discharge. No scleral icterus.  Neck: Normal range of motion. Neck supple. No JVD present. No tracheal deviation present. No thyromegaly  present.  Cardiovascular: Normal rate, regular rhythm, normal heart sounds and intact distal pulses. Exam reveals no gallop and no friction rub.  No murmur heard. Pulmonary/Chest: Effort normal and breath sounds normal. No stridor. No respiratory distress. He has no wheezes. He has no rales. He exhibits no tenderness.  Abdominal: Soft. Bowel sounds are normal. He exhibits no distension and no mass. There is no tenderness. There is no rebound and no guarding. Hernia confirmed negative in the right inguinal area and confirmed negative in the left inguinal area.  Genitourinary: Testes normal and penis normal. Right testis shows no mass and no tenderness. Left testis shows no mass and no tenderness.  Musculoskeletal: Normal range of motion. He exhibits no edema or tenderness.  Lymphadenopathy:    He has no cervical adenopathy.       Right: No inguinal adenopathy present.       Left: No inguinal adenopathy present.  Neurological: He is alert and oriented to person, place, and time. He has normal reflexes. No cranial nerve deficit. He exhibits normal muscle tone. Coordination normal.  Skin: Skin is warm. No rash noted. He is not diaphoretic. No erythema. No pallor.  Psychiatric: He has a normal mood and affect. His behavior is normal. Judgment and thought content normal.  Vitals reviewed.         Assessment & Plan:  General medical exam - Plan: CBC with Differential/Platelet, COMPLETE METABOLIC PANEL WITH GFR, Lipid panel, PSA  Polyuria - Plan: PSA  Chronic fatigue - Plan: TSH, Testosterone Total,Free,Bio, Males  Because of the polyuria, I will check a PSA.  I believe this is likely due to mild enlargement of his prostate.  Because of his fatigue I will also check a TSH, and a testosterone level.  However I agree with the patient is likely due to poor sleep hygiene.  Check CBC, CMP, fasting lipid panel.  He received his flu shot and his tetanus vaccine today.  He is due for a colonoscopy next  year based on his family history.  Regular anticipatory guidance is provided.  Blood pressure is excellent.

## 2018-07-04 LAB — COMPLETE METABOLIC PANEL WITH GFR
AG Ratio: 1.7 (calc) (ref 1.0–2.5)
ALBUMIN MSPROF: 4.4 g/dL (ref 3.6–5.1)
ALT: 12 U/L (ref 9–46)
AST: 16 U/L (ref 10–40)
Alkaline phosphatase (APISO): 68 U/L (ref 40–115)
BUN: 14 mg/dL (ref 7–25)
CALCIUM: 9.9 mg/dL (ref 8.6–10.3)
CO2: 25 mmol/L (ref 20–32)
CREATININE: 0.93 mg/dL (ref 0.60–1.35)
Chloride: 103 mmol/L (ref 98–110)
GFR, EST AFRICAN AMERICAN: 115 mL/min/{1.73_m2} (ref >=60)
GFR, EST NON AFRICAN AMERICAN: 99 mL/min/{1.73_m2} (ref >=60)
GLUCOSE: 93 mg/dL (ref 65–99)
Globulin: 2.6 g/dL (calc) (ref 1.9–3.7)
Potassium: 4.8 mmol/L (ref 3.5–5.3)
Sodium: 140 mmol/L (ref 135–146)
TOTAL PROTEIN: 7 g/dL (ref 6.1–8.1)
Total Bilirubin: 0.6 mg/dL (ref 0.2–1.2)

## 2018-07-04 LAB — LIPID PANEL
CHOL/HDL RATIO: 2.4 (calc) (ref <5.0)
Cholesterol: 137 mg/dL (ref <200)
HDL: 58 mg/dL (ref >40)
LDL CHOLESTEROL (CALC): 66 mg/dL
Non-HDL Cholesterol (Calc): 79 mg/dL (calc) (ref <130)
TRIGLYCERIDES: 45 mg/dL (ref <150)

## 2018-07-04 LAB — TESTOSTERONE TOTAL,FREE,BIO, MALES
ALBUMIN MSPROF: 4.4 g/dL (ref 3.6–5.1)
Sex Hormone Binding: 44 nmol/L (ref 10–50)
Testosterone, Bioavailable: 68.6 ng/dL — ABNORMAL LOW (ref >=110.0)
Testosterone, Free: 34.1 pg/mL — ABNORMAL LOW (ref 46.0–224.0)
Testosterone: 337 ng/dL (ref 250–827)

## 2018-07-04 LAB — CBC WITH DIFFERENTIAL/PLATELET
BASOS ABS: 62 {cells}/uL (ref 0–200)
Basophils Relative: 1.1 %
EOS ABS: 78 {cells}/uL (ref 15–500)
Eosinophils Relative: 1.4 %
HEMATOCRIT: 46.3 % (ref 38.5–50.0)
HEMOGLOBIN: 15.9 g/dL (ref 13.2–17.1)
LYMPHS ABS: 2402 {cells}/uL (ref 850–3900)
MCH: 30.9 pg (ref 27.0–33.0)
MCHC: 34.3 g/dL (ref 32.0–36.0)
MCV: 90.1 fL (ref 80.0–100.0)
MPV: 14.4 fL — ABNORMAL HIGH (ref 7.5–12.5)
Monocytes Relative: 11 %
NEUTROS ABS: 2442 {cells}/uL (ref 1500–7800)
NEUTROS PCT: 43.6 %
Platelets: 157 10*3/uL (ref 140–400)
RBC: 5.14 10*6/uL (ref 4.20–5.80)
RDW: 12.5 % (ref 11.0–15.0)
Total Lymphocyte: 42.9 %
WBC mixed population: 616 cells/uL (ref 200–950)
WBC: 5.6 10*3/uL (ref 3.8–10.8)

## 2018-07-04 LAB — PSA: PSA: 1.4 ng/mL (ref <=4.0)

## 2018-07-04 LAB — TSH: TSH: 0.62 mIU/L (ref 0.40–4.50)

## 2018-09-19 DIAGNOSIS — M546 Pain in thoracic spine: Secondary | ICD-10-CM | POA: Diagnosis not present

## 2018-09-19 DIAGNOSIS — M542 Cervicalgia: Secondary | ICD-10-CM | POA: Diagnosis not present

## 2018-09-19 DIAGNOSIS — M9902 Segmental and somatic dysfunction of thoracic region: Secondary | ICD-10-CM | POA: Diagnosis not present

## 2018-09-19 DIAGNOSIS — M9901 Segmental and somatic dysfunction of cervical region: Secondary | ICD-10-CM | POA: Diagnosis not present

## 2018-10-29 DIAGNOSIS — S8264XA Nondisplaced fracture of lateral malleolus of right fibula, initial encounter for closed fracture: Secondary | ICD-10-CM | POA: Diagnosis not present

## 2018-10-29 DIAGNOSIS — M25571 Pain in right ankle and joints of right foot: Secondary | ICD-10-CM | POA: Diagnosis not present

## 2018-10-29 DIAGNOSIS — S8292XA Unspecified fracture of left lower leg, initial encounter for closed fracture: Secondary | ICD-10-CM | POA: Diagnosis not present

## 2018-11-05 DIAGNOSIS — M25572 Pain in left ankle and joints of left foot: Secondary | ICD-10-CM | POA: Diagnosis not present

## 2018-11-05 DIAGNOSIS — M25571 Pain in right ankle and joints of right foot: Secondary | ICD-10-CM | POA: Diagnosis not present

## 2018-11-27 DIAGNOSIS — M25572 Pain in left ankle and joints of left foot: Secondary | ICD-10-CM | POA: Diagnosis not present

## 2018-12-27 DIAGNOSIS — M25572 Pain in left ankle and joints of left foot: Secondary | ICD-10-CM | POA: Diagnosis not present

## 2020-04-16 ENCOUNTER — Ambulatory Visit (INDEPENDENT_AMBULATORY_CARE_PROVIDER_SITE_OTHER): Payer: BC Managed Care – PPO | Admitting: Family Medicine

## 2020-04-16 ENCOUNTER — Other Ambulatory Visit: Payer: Self-pay

## 2020-04-16 VITALS — BP 140/70 | HR 57 | Temp 97.5°F | Ht 70.0 in | Wt 154.0 lb

## 2020-04-16 DIAGNOSIS — Z20822 Contact with and (suspected) exposure to covid-19: Secondary | ICD-10-CM | POA: Diagnosis not present

## 2020-04-16 DIAGNOSIS — Z1211 Encounter for screening for malignant neoplasm of colon: Secondary | ICD-10-CM | POA: Diagnosis not present

## 2020-04-16 DIAGNOSIS — Z0001 Encounter for general adult medical examination with abnormal findings: Secondary | ICD-10-CM | POA: Diagnosis not present

## 2020-04-16 DIAGNOSIS — Z0000 Encounter for general adult medical examination without abnormal findings: Secondary | ICD-10-CM | POA: Diagnosis not present

## 2020-04-16 DIAGNOSIS — Z136 Encounter for screening for cardiovascular disorders: Secondary | ICD-10-CM | POA: Diagnosis not present

## 2020-04-16 DIAGNOSIS — R350 Frequency of micturition: Secondary | ICD-10-CM | POA: Diagnosis not present

## 2020-04-16 DIAGNOSIS — Z13220 Encounter for screening for lipoid disorders: Secondary | ICD-10-CM | POA: Diagnosis not present

## 2020-04-16 LAB — URINALYSIS, ROUTINE W REFLEX MICROSCOPIC
Bacteria, UA: NONE SEEN /HPF
Bilirubin Urine: NEGATIVE
Glucose, UA: NEGATIVE
Hyaline Cast: NONE SEEN /LPF
Ketones, ur: NEGATIVE
Leukocytes,Ua: NEGATIVE
Nitrite: NEGATIVE
Protein, ur: NEGATIVE
Specific Gravity, Urine: 1.029 (ref 1.001–1.03)
pH: 6 (ref 5.0–8.0)

## 2020-04-16 LAB — MICROSCOPIC MESSAGE

## 2020-04-16 MED ORDER — FLUTICASONE PROPIONATE 50 MCG/ACT NA SUSP: 2.0000 | Freq: Every day | NASAL | 6 refills | Status: DC

## 2020-04-16 MED ORDER — AZITHROMYCIN 250 MG PO TABS: ORAL_TABLET | ORAL | 0 refills | Status: DC

## 2020-04-16 NOTE — Progress Notes (Signed)
Subjective:    Patient ID: Wesley Middleton, male    DOB: 11/22/1975, 44 y.o.   MRN: [id number]  HPI patient thinks he may have had Covid several weeks ago. He had fevers. He had chills. He lost his sense of smell and taste. He has not had the Covid vaccine. He is here today for a physical. His blood pressure today is borderline at 140/70. He denies any chest pain or shortness of breath. He did recently have a sinus infection with pain in both frontal sinuses and head congestion. This is been persistent for more than a week.  No current outpatient medications on file prior to visit.   No current facility-administered medications on file prior to visit.   Allergies  Allergen Reactions   Ceclor [Cefaclor] Other (See Comments)    Pt had reaction as a child   Penicillins Other (See Comments)    Had reaction as child   Social History   Socioeconomic History   Marital status: Married    Spouse name: Not on file   Number of children: Not on file   Years of education: Not on file   Highest education level: Not on file  Occupational History   Not on file  Tobacco Use   Smoking status: Current Some Day Smoker    Types: Cigarettes   Smokeless tobacco: Current User    Types: Snuff  Substance and Sexual Activity   Alcohol use: Yes    Alcohol/week: 0.0 standard drinks    Comment: Occasionally   Drug use: No   Sexual activity: Not on file  Other Topics Concern   Not on file  Social History Narrative   Not on file   Social Determinants of Health   Financial Resource Strain:    Difficulty of Paying Living Expenses: Not on file  Food Insecurity:    Worried About Running Out of Food in the Last Year: Not on file   Ran Out of Food in the Last Year: Not on file  Transportation Needs:    Lack of Transportation (Medical): Not on file   Lack of Transportation (Non-Medical): Not on file  Physical Activity:    Days of Exercise per Week: Not on file   Minutes  of Exercise per Session: Not on file  Stress:    Feeling of Stress : Not on file  Social Connections:    Frequency of Communication with Friends and Family: Not on file   Frequency of Social Gatherings with Friends and Family: Not on file   Attends Religious Services: Not on file   Active Member of Clubs or Organizations: Not on file   Attends Banker Meetings: Not on file   Marital Status: Not on file  Intimate Partner Violence:    Fear of Current or Ex-Partner: Not on file   Emotionally Abused: Not on file   Physically Abused: Not on file   Sexually Abused: Not on file   Family History  Problem Relation Age of Onset   Cancer Mother        colon (43)   Stroke Paternal Grandfather       Review of Systems  All other systems reviewed and are negative.      Objective:   Physical Exam Vitals reviewed.  Constitutional:      General: He is not in acute distress.    Appearance: He is well-developed. He is not diaphoretic.  HENT:     Head: Normocephalic and atraumatic.  Right Ear: External ear normal.     Nose: Nose normal.     Mouth/Throat:     Pharynx: No oropharyngeal exudate.  Eyes:     General: No scleral icterus.       Right eye: No discharge.        Left eye: No discharge.     Conjunctiva/sclera: Conjunctivae normal.     Pupils: Pupils are equal, round, and reactive to light.  Neck:     Thyroid: No thyromegaly.     Vascular: No JVD.     Trachea: No tracheal deviation.  Cardiovascular:     Rate and Rhythm: Normal rate and regular rhythm.     Heart sounds: Normal heart sounds. No murmur heard.  No friction rub. No gallop.   Pulmonary:     Effort: Pulmonary effort is normal. No respiratory distress.     Breath sounds: Normal breath sounds. No stridor. No wheezing or rales.  Chest:     Chest wall: No tenderness.  Abdominal:     General: Bowel sounds are normal. There is no distension.     Palpations: Abdomen is soft. There is no  mass.     Tenderness: There is no abdominal tenderness. There is no guarding or rebound.     Hernia: There is no hernia in the left inguinal area.  Genitourinary:    Penis: Normal.      Testes: Normal.        Right: Mass or tenderness not present.        Left: Mass or tenderness not present.  Musculoskeletal:        General: No tenderness. Normal range of motion.     Cervical back: Normal range of motion and neck supple.  Lymphadenopathy:     Cervical: No cervical adenopathy.     Lower Body: No right inguinal adenopathy. No left inguinal adenopathy.  Skin:    General: Skin is warm.     Coloration: Skin is not pale.     Findings: No erythema or rash.  Neurological:     Mental Status: He is alert and oriented to person, place, and time.     Cranial Nerves: No cranial nerve deficit.     Motor: No abnormal muscle tone.     Coordination: Coordination normal.     Deep Tendon Reflexes: Reflexes are normal and symmetric.  Psychiatric:        Behavior: Behavior normal.        Thought Content: Thought content normal.        Judgment: Judgment normal.           Assessment & Plan:  Close exposure to COVID-19 virus - Plan: SARS CoV2 Serology(COVID19) AB(IgG,IgM),Immunoassay  General medical exam - Plan: CBC with Differential/Platelet, COMPLETE METABOLIC PANEL WITH GFR, Lipid panel  Urinary frequency - Plan: Urinalysis, Routine w reflex microscopic, Urine Culture  Colon cancer screening - Plan: Ambulatory referral to Gastroenterology  I will treat the patient sinus infection with a Z-Pak and Flonase. I will check lab work to see if he had Covid more than 3 weeks ago. Check a CBC, CMP, fasting lipid panel. Consult GI due to the patient's strong family history of colon cancer for a colonoscopy. Strongly recommended the Covid vaccine if he is not immune. We will check a urinalysis as afterward the patient reported some urinary frequency

## 2020-04-17 ENCOUNTER — Encounter (INDEPENDENT_AMBULATORY_CARE_PROVIDER_SITE_OTHER): Payer: Self-pay | Admitting: *Deleted

## 2020-04-17 LAB — CBC WITH DIFFERENTIAL/PLATELET
Absolute Monocytes: 932 cells/uL (ref 200–950)
Basophils Absolute: 73 cells/uL (ref 0–200)
Basophils Relative: 0.9 %
Eosinophils Absolute: 89 cells/uL (ref 15–500)
Eosinophils Relative: 1.1 %
HCT: 44.5 % (ref 38.5–50.0)
Hemoglobin: 14.8 g/dL (ref 13.2–17.1)
Lymphs Abs: 1515 cells/uL (ref 850–3900)
MCH: 30.9 pg (ref 27.0–33.0)
MCHC: 33.3 g/dL (ref 32.0–36.0)
MCV: 92.9 fL (ref 80.0–100.0)
MPV: 14.3 fL — ABNORMAL HIGH (ref 7.5–12.5)
Monocytes Relative: 11.5 %
Neutro Abs: 5492 cells/uL (ref 1500–7800)
Neutrophils Relative %: 67.8 %
Platelets: 136 10*3/uL — ABNORMAL LOW (ref 140–400)
RBC: 4.79 10*6/uL (ref 4.20–5.80)
RDW: 12.9 % (ref 11.0–15.0)
Total Lymphocyte: 18.7 %
WBC: 8.1 10*3/uL (ref 3.8–10.8)

## 2020-04-17 LAB — URINE CULTURE
MICRO NUMBER:: 10876425
Result:: NO GROWTH
SPECIMEN QUALITY:: ADEQUATE

## 2020-04-17 LAB — COMPLETE METABOLIC PANEL WITH GFR
AG Ratio: 1.8 (calc) (ref 1.0–2.5)
ALT: 14 U/L (ref 9–46)
AST: 15 U/L (ref 10–40)
Albumin: 4.4 g/dL (ref 3.6–5.1)
Alkaline phosphatase (APISO): 53 U/L (ref 36–130)
BUN: 14 mg/dL (ref 7–25)
CO2: 31 mmol/L (ref 20–32)
Calcium: 9.5 mg/dL (ref 8.6–10.3)
Chloride: 104 mmol/L (ref 98–110)
Creat: 0.96 mg/dL (ref 0.60–1.35)
GFR, Est African American: 110 mL/min/{1.73_m2} (ref >=60)
GFR, Est Non African American: 95 mL/min/{1.73_m2} (ref >=60)
Globulin: 2.4 g/dL (calc) (ref 1.9–3.7)
Glucose, Bld: 101 mg/dL — ABNORMAL HIGH (ref 65–99)
Potassium: 4.1 mmol/L (ref 3.5–5.3)
Sodium: 140 mmol/L (ref 135–146)
Total Bilirubin: 0.7 mg/dL (ref 0.2–1.2)
Total Protein: 6.8 g/dL (ref 6.1–8.1)

## 2020-04-17 LAB — SARS COV-2 SEROLOGY(COVID-19)AB(IGG,IGM),IMMUNOASSAY
SARS CoV-2 AB IgG: NEGATIVE
SARS CoV-2 IgM: NEGATIVE

## 2020-04-17 LAB — LIPID PANEL
Cholesterol: 126 mg/dL (ref <200)
HDL: 52 mg/dL (ref >=40)
LDL Cholesterol (Calc): 63 mg/dL (calc)
Non-HDL Cholesterol (Calc): 74 mg/dL (calc) (ref <130)
Total CHOL/HDL Ratio: 2.4 (calc) (ref <5.0)
Triglycerides: 39 mg/dL (ref <150)

## 2020-05-11 DIAGNOSIS — M542 Cervicalgia: Secondary | ICD-10-CM | POA: Diagnosis not present

## 2020-05-11 DIAGNOSIS — M9901 Segmental and somatic dysfunction of cervical region: Secondary | ICD-10-CM | POA: Diagnosis not present

## 2020-05-11 DIAGNOSIS — M545 Low back pain: Secondary | ICD-10-CM | POA: Diagnosis not present

## 2020-05-11 DIAGNOSIS — M9903 Segmental and somatic dysfunction of lumbar region: Secondary | ICD-10-CM | POA: Diagnosis not present

## 2020-09-25 ENCOUNTER — Telehealth: Payer: BLUE CROSS/BLUE SHIELD | Admitting: Nurse Practitioner

## 2020-09-25 DIAGNOSIS — Z20822 Contact with and (suspected) exposure to covid-19: Secondary | ICD-10-CM

## 2020-09-25 DIAGNOSIS — R059 Cough, unspecified: Secondary | ICD-10-CM

## 2020-09-25 DIAGNOSIS — R52 Pain, unspecified: Secondary | ICD-10-CM

## 2020-09-25 DIAGNOSIS — R5081 Fever presenting with conditions classified elsewhere: Secondary | ICD-10-CM

## 2020-09-25 MED ORDER — BENZONATATE 100 MG PO CAPS: 100.0000 mg | ORAL_CAPSULE | Freq: Three times a day (TID) | ORAL | 0 refills | Status: DC | PRN

## 2020-09-25 MED ORDER — NAPROXEN 500 MG PO TABS: 500.0000 mg | ORAL_TABLET | Freq: Two times a day (BID) | ORAL | 0 refills | Status: DC

## 2020-09-25 NOTE — Progress Notes (Signed)
E-Visit for Corona Virus Screening  Your current symptoms could be consistent with the coronavirus.  Many health care providers can now test patients at their office but not all are.  [facility] has multiple testing sites. For information on our COVID testing locations and hours go to [email or web address]   Testing Information: The COVID-19 Community Testing sites are testing BY APPOINTMENT ONLY.  You can schedule online at [email or web address]  If you do not have access to a smart phone or computer you may call [phone] for an appointment.   Additional testing sites in the Community:  . For [facility] Testing sites in [place]  https://www.[facility]vs.com/[email or web address]/covid-19-testing  . For Pop-up testing sites in [place]  https://covid19.ncd[facility]hs.gov/about-covid-19/testing/find-my-testing-place/pop-testing-sites  . For [facility] https://[place].gov[place]/human-services/health-department/coronavirus-covid-19-info/covid-19-testing  . For [place] testing in [place] and [place] https://www.[place].gov/[place]/human-services/health-department/coronavirus-covid-19-info/covid-19-testing  . For [facility] testing in [place]   https://[facility].care/[email or web address]  For  more information about community testing call [phone]   Please quarantine yourself while awaiting your test results. Please stay home for a minimum of 10 days from the first day of illness with improving symptoms and you have had 24 hours of no fever (without the use of Tylenol (Acetaminophen) Motrin (Ibuprofen) or any fever reducing medication).  Also - Do not get tested prior to returning to work because once you have had a positive test the test can stay positive for more than a month in some cases.   You should wear a mask or cloth face covering over your nose and mouth if you must be around other people or animals, including pets (even at home). Try to  stay at least 6 feet away from other people. This will protect the people around you.  Please continue good preventive care measures, including:  frequent hand-washing, avoid touching your face, cover coughs/sneezes, stay out of crowds and keep a 6 foot distance from others.  COVID-19 is a respiratory illness with symptoms that are similar to the flu. Symptoms are typically mild to moderate, but there have been cases of severe illness and death due to the virus.   The following symptoms may appear 2-14 days after exposure: . Fever . Cough . Shortness of breath or difficulty breathing . Chills . Repeated shaking with chills . Muscle pain . Headache . Sore throat . New loss of taste or smell . Fatigue . Congestion or runny nose . Nausea or vomiting . Diarrhea  Go to the nearest hospital ED for assessment if fever/cough/breathlessness are severe or illness seems like a threat to life.  It is vitally important that if you feel that you have an infection such as this virus or any other virus that you stay home and away from places where you may spread it to others.  You should avoid contact with people age 65 and older.   You can use medication such as A prescription cough medication called Tessalon Perles 100 mg. You may take 1-2 capsules every 8 hours as needed for cough and A prescription anti-inflammatory called Naprosyn 500 mg. Take twice daily as needed for fever or body aches for 2 weeks  You may also take acetaminophen (Tylenol) as needed for fever.  Reduce your risk of any infection by using the same precautions used for avoiding the common cold or flu:  . Wash your hands often with soap and warm water for at least 20 seconds.  If soap and water are not readily available, use an alcohol-based hand sanitizer with at least 60% alcohol.  .   If coughing or sneezing, cover your mouth and nose by coughing or sneezing into the elbow areas of your shirt or coat, into a tissue or into your sleeve  (not your hands). . Avoid shaking hands with others and consider head nods or verbal greetings only. . Avoid touching your eyes, nose, or mouth with unwashed hands.  . Avoid close contact with people who are sick. . Avoid places or events with large numbers of people in one location, like concerts or sporting events. . Carefully consider travel plans you have or are making. . If you are planning any travel outside or inside the [place], visit the CDC's Travelers' Health webpage for the latest health notices. . If you have some symptoms but not all symptoms, continue to monitor at home and seek medical attention if your symptoms worsen. . If you are having a medical emergency, call 911.  HOME CARE . Only take medications as instructed by your medical team. . Drink plenty of fluids and get plenty of rest. . A steam or ultrasonic humidifier can help if you have congestion.   GET HELP RIGHT AWAY IF YOU HAVE EMERGENCY WARNING SIGNS** FOR COVID-19. If you or someone is showing any of these signs seek emergency medical care immediately. Call 911 or proceed to your closest emergency facility if: . You develop worsening high fever. . Trouble breathing . Bluish lips or face . Persistent pain or pressure in the chest . New confusion . Inability to wake or stay awake . You cough up blood. . Your symptoms become more severe  **This list is not all possible symptoms. Contact your medical provider for any symptoms that are sever or concerning to you.  MAKE SURE YOU   Understand these instructions.  Will watch your condition.  Will get help right away if you are not doing well or get worse.  Your e-visit answers were reviewed by a board certified advanced clinical practitioner to complete your personal care plan.  Depending on the condition, your plan could have included both over the counter or prescription medications.  If there is a problem please reply once you have received a response from your  provider.  Your safety is important to us.  If you have drug allergies check your prescription carefully.    You can use MyChart to ask questions about today's visit, request a non-urgent call back, or ask for a work or school excuse for 24 hours related to this e-Visit. If it has been greater than 24 hours you will need to follow up with your provider, or enter a new e-Visit to address those concerns. You will get an e-mail in the next two days asking about your experience.  I hope that your e-visit has been valuable and will speed your recovery. Thank you for using e-visits.   5-10 minutes spent reviewing and documenting in chart.  

## 2020-09-26 ENCOUNTER — Encounter: Payer: Self-pay | Admitting: Nurse Practitioner

## 2021-10-05 ENCOUNTER — Other Ambulatory Visit: Payer: Self-pay

## 2021-10-05 ENCOUNTER — Ambulatory Visit (INDEPENDENT_AMBULATORY_CARE_PROVIDER_SITE_OTHER): Payer: BC Managed Care – PPO | Admitting: Family Medicine

## 2021-10-05 ENCOUNTER — Ambulatory Visit: Payer: BC Managed Care – PPO | Admitting: Family Medicine

## 2021-10-05 ENCOUNTER — Encounter: Payer: Self-pay | Admitting: Family Medicine

## 2021-10-05 VITALS — BP 118/82 | HR 70 | Temp 97.7°F | Resp 18 | Ht 70.0 in | Wt 159.0 lb

## 2021-10-05 DIAGNOSIS — Z0000 Encounter for general adult medical examination without abnormal findings: Secondary | ICD-10-CM

## 2021-10-05 DIAGNOSIS — R399 Unspecified symptoms and signs involving the genitourinary system: Secondary | ICD-10-CM

## 2021-10-05 DIAGNOSIS — Z1211 Encounter for screening for malignant neoplasm of colon: Secondary | ICD-10-CM | POA: Diagnosis not present

## 2021-10-05 NOTE — Progress Notes (Signed)
Subjective:    Patient ID: Wesley Middleton, male    DOB: 1975/09/24, 46 y.o.   MRN: [id number]  HPI Patient is a very pleasant 46 year old Caucasian gentleman who is here today for complete physical exam.  He denies any major concerns.  He does report some lower urinary tract symptoms.  He states that he has increased urinary frequency, weaker urinary stream.  He does not feel like he is able to completely evacuate his bladder.  He denies any dysuria or hematuria or urgency or pain.  Otherwise he is doing well with no concerns.  His tetanus shot is up-to-date and not due again until 2029.  He is due for a flu shot and the COVID booster however he politely declines these.  He is overdue for colonoscopy and he agrees to allow me to schedule this for him.  His blood pressure today is outstanding at [place]. History reviewed. No pertinent past medical history. Past Surgical History:  Procedure Laterality Date   VARICOCELECTOMY     left testicle   No current outpatient medications on file prior to visit.   No current facility-administered medications on file prior to visit.   Allergies  Allergen Reactions   Ceclor [Cefaclor] Other (See Comments)    Pt had reaction as a child   Penicillins Other (See Comments)    Had reaction as child   Social History   Socioeconomic History   Marital status: Married    Spouse name: Not on file   Number of children: Not on file   Years of education: Not on file   Highest education level: Not on file  Occupational History   Not on file  Tobacco Use   Smoking status: Some Days    Types: Cigarettes   Smokeless tobacco: Current    Types: Snuff  Substance and Sexual Activity   Alcohol use: Yes    Alcohol/week: 0.0 standard drinks    Comment: Occasionally   Drug use: No   Sexual activity: Not on file  Other Topics Concern   Not on file  Social History Narrative   Not on file   Social Determinants of Health   Financial Resource Strain: Not  on file  Food Insecurity: Not on file  Transportation Needs: Not on file  Physical Activity: Not on file  Stress: Not on file  Social Connections: Not on file  Intimate Partner Violence: Not on file   Family History  Problem Relation Age of Onset   Cancer Mother        colon (57)   Stroke Paternal Grandfather       Review of Systems     Objective:   Physical Exam Vitals reviewed.  Constitutional:      General: He is not in acute distress.    Appearance: Normal appearance. He is normal weight. He is not ill-appearing, toxic-appearing or diaphoretic.  HENT:     Head: Normocephalic and atraumatic.     Right Ear: Tympanic membrane and ear canal normal. There is no impacted cerumen.     Left Ear: Tympanic membrane and ear canal normal. There is no impacted cerumen.     Nose: Nose normal. No congestion or rhinorrhea.     Mouth/Throat:     Mouth: Mucous membranes are moist.     Pharynx: Oropharynx is clear. No oropharyngeal exudate or posterior oropharyngeal erythema.  Eyes:     General: No scleral icterus.       Right eye: No discharge.  Left eye: No discharge.     Extraocular Movements: Extraocular movements intact.     Conjunctiva/sclera: Conjunctivae normal.     Pupils: Pupils are equal, round, and reactive to light.  Neck:     Vascular: No carotid bruit.  Cardiovascular:     Rate and Rhythm: Normal rate and regular rhythm.     Pulses: Normal pulses.     Heart sounds: Normal heart sounds. No murmur heard.   No friction rub. No gallop.  Pulmonary:     Effort: Pulmonary effort is normal. No respiratory distress.     Breath sounds: Normal breath sounds. No stridor. No wheezing, rhonchi or rales.  Abdominal:     General: Abdomen is flat. Bowel sounds are normal. There is no distension.     Palpations: Abdomen is soft.     Tenderness: There is no abdominal tenderness. There is no guarding or rebound.     Hernia: No hernia is present.  Musculoskeletal:         General: No swelling, tenderness, deformity or signs of injury.     Cervical back: Normal range of motion and neck supple. No rigidity or tenderness.     Right lower leg: No edema.     Left lower leg: No edema.  Lymphadenopathy:     Cervical: No cervical adenopathy.  Skin:    General: Skin is warm.     Coloration: Skin is not jaundiced or pale.     Findings: No bruising, erythema, lesion or rash.  Neurological:     General: No focal deficit present.     Mental Status: He is alert and oriented to person, place, and time. Mental status is at baseline.     Cranial Nerves: No cranial nerve deficit.     Sensory: No sensory deficit.     Motor: No weakness.     Coordination: Coordination normal.     Gait: Gait normal.     Deep Tendon Reflexes: Reflexes normal.          Assessment & Plan:  Colon cancer screening - Plan: Ambulatory referral to Gastroenterology  General medical exam - Plan: CBC with Differential/Platelet, Lipid panel, COMPLETE METABOLIC PANEL WITH GFR, PSA Patient has a family history of colon cancer in his mother.  He is also overdue for colonoscopy.  Therefore I will schedule him to meet with a gastroenterologist for colonoscopy.  Given his lower urinary tract symptoms, I suspect that he has BPH.  The other possibility would be overactive bladder.  Therefore I would like to get a PSA today just to be thorough.  He declines treatment with Flomax.  I will check a CBC, CMP, lipid panel.  Offered the patient a flu shot as well as a COVID-vaccine which he politely declined.  The patient does use oral tobacco.  I do not see any abnormal lesions in his mouth.  Did recommend tobacco cessation.

## 2021-10-06 ENCOUNTER — Encounter (INDEPENDENT_AMBULATORY_CARE_PROVIDER_SITE_OTHER): Payer: Self-pay | Admitting: *Deleted

## 2021-10-06 LAB — COMPLETE METABOLIC PANEL WITH GFR
AG Ratio: 1.7 (calc) (ref 1.0–2.5)
ALT: 21 U/L (ref 9–46)
AST: 20 U/L (ref 10–40)
Albumin: 4.3 g/dL (ref 3.6–5.1)
Alkaline phosphatase (APISO): 59 U/L (ref 36–130)
BUN: 15 mg/dL (ref 7–25)
CO2: 29 mmol/L (ref 20–32)
Calcium: 9.5 mg/dL (ref 8.6–10.3)
Chloride: 103 mmol/L (ref 98–110)
Creat: 0.96 mg/dL (ref 0.60–1.29)
Globulin: 2.5 g/dL (calc) (ref 1.9–3.7)
Glucose, Bld: 110 mg/dL — ABNORMAL HIGH (ref 65–99)
Potassium: 4.4 mmol/L (ref 3.5–5.3)
Sodium: 139 mmol/L (ref 135–146)
Total Bilirubin: 0.7 mg/dL (ref 0.2–1.2)
Total Protein: 6.8 g/dL (ref 6.1–8.1)
eGFR: 98 mL/min/{1.73_m2} (ref >=60)

## 2021-10-06 LAB — CBC WITH DIFFERENTIAL/PLATELET
Absolute Monocytes: 641 cells/uL (ref 200–950)
Basophils Absolute: 42 cells/uL (ref 0–200)
Basophils Relative: 0.8 %
Eosinophils Absolute: 69 cells/uL (ref 15–500)
Eosinophils Relative: 1.3 %
HCT: 45.3 % (ref 38.5–50.0)
Hemoglobin: 15.1 g/dL (ref 13.2–17.1)
Lymphs Abs: 1897 cells/uL (ref 850–3900)
MCH: 30.8 pg (ref 27.0–33.0)
MCHC: 33.3 g/dL (ref 32.0–36.0)
MCV: 92.3 fL (ref 80.0–100.0)
MPV: 13.6 fL — ABNORMAL HIGH (ref 7.5–12.5)
Monocytes Relative: 12.1 %
Neutro Abs: 2650 cells/uL (ref 1500–7800)
Neutrophils Relative %: 50 %
Platelets: 144 10*3/uL (ref 140–400)
RBC: 4.91 10*6/uL (ref 4.20–5.80)
RDW: 12.5 % (ref 11.0–15.0)
Total Lymphocyte: 35.8 %
WBC: 5.3 10*3/uL (ref 3.8–10.8)

## 2021-10-06 LAB — PSA: PSA: 1.49 ng/mL (ref <=4.00)

## 2021-10-06 LAB — LIPID PANEL
Cholesterol: 136 mg/dL (ref <200)
HDL: 52 mg/dL (ref >=40)
LDL Cholesterol (Calc): 70 mg/dL (calc)
Non-HDL Cholesterol (Calc): 84 mg/dL (calc) (ref <130)
Total CHOL/HDL Ratio: 2.6 (calc) (ref <5.0)
Triglycerides: 49 mg/dL (ref <150)

## 2021-10-11 ENCOUNTER — Other Ambulatory Visit: Payer: Self-pay | Admitting: Orthopedic Surgery

## 2021-10-11 DIAGNOSIS — S92901A Unspecified fracture of right foot, initial encounter for closed fracture: Secondary | ICD-10-CM

## 2021-10-14 ENCOUNTER — Ambulatory Visit
Admission: RE | Admit: 2021-10-14 | Discharge: 2021-10-14 | Disposition: A | Discharge status: Home / Self Care | Payer: BC Managed Care – PPO | Source: Ambulatory Visit | Attending: Orthopedic Surgery | Admitting: Orthopedic Surgery

## 2021-10-14 DIAGNOSIS — S92901A Unspecified fracture of right foot, initial encounter for closed fracture: Secondary | ICD-10-CM

## 2022-07-01 ENCOUNTER — Encounter: Payer: Self-pay | Admitting: Family Medicine

## 2022-07-04 ENCOUNTER — Other Ambulatory Visit: Payer: Self-pay | Admitting: Family Medicine

## 2022-07-04 MED ORDER — HYDROCODONE BIT-HOMATROP MBR 5-1.5 MG/5ML PO SOLN: 5.0000 mL | Freq: Three times a day (TID) | ORAL | 0 refills | Status: DC | PRN

## 2022-09-20 ENCOUNTER — Encounter: Payer: Self-pay | Admitting: Family Medicine

## 2022-09-20 ENCOUNTER — Ambulatory Visit: Payer: BC Managed Care – PPO | Admitting: Family Medicine

## 2022-09-20 VITALS — BP 114/64 | HR 59 | Ht 70.0 in | Wt 156.4 lb

## 2022-09-20 DIAGNOSIS — Z1211 Encounter for screening for malignant neoplasm of colon: Secondary | ICD-10-CM

## 2022-09-20 DIAGNOSIS — M542 Cervicalgia: Secondary | ICD-10-CM | POA: Diagnosis not present

## 2022-09-20 NOTE — Progress Notes (Signed)
Subjective:    Patient ID: Wesley Middleton, male    DOB: 17-Oct-1975, 47 y.o.   MRN: [id number]  HPI Patient has been having right-sided neck pain located over the right lateral trapezius muscle for over a week.  He would have a headache in his right occiput in that exact same area near the attachment of the right trapezius muscle when he woke up in the morning.  This was occurring every day.  The headache was throbbing in nature.  There was no photophobia or phonophobia.  There is no neurologic deficit.  He denied any nausea or vomiting.  He saw chiropractor who manipulated his neck.  Several days afterward the pain gradually subsided and his headache has gone away.  He is no longer having a headache although he is still slightly tender over the right lateral trapezius muscle.  He denies any numbness or tingling in his right arm.  He denies any weakness in his right arm.  There is no rash in the affected area.  There is no palpable mass or abnormality on his exam. No past medical history on file. Past Surgical History:  Procedure Laterality Date   VARICOCELECTOMY     left testicle   Current Outpatient Medications on File Prior to Visit  Medication Sig Dispense Refill   HYDROcodone bit-homatropine (HYCODAN) 5-1.5 MG/5ML syrup Take 5 mLs by mouth every 8 (eight) hours as needed for cough. 120 mL 0   No current facility-administered medications on file prior to visit.   Allergies  Allergen Reactions   Ceclor [Cefaclor] Other (See Comments)    Pt had reaction as a child   Penicillins Other (See Comments)    Had reaction as child   Social History   Socioeconomic History   Marital status: Married    Spouse name: Not on file   Number of children: Not on file   Years of education: Not on file   Highest education level: Not on file  Occupational History   Not on file  Tobacco Use   Smoking status: Some Days    Types: Cigarettes   Smokeless tobacco: Current    Types: Snuff   Substance and Sexual Activity   Alcohol use: Yes    Alcohol/week: 0.0 standard drinks of alcohol    Comment: Occasionally   Drug use: No   Sexual activity: Not on file  Other Topics Concern   Not on file  Social History Narrative   Not on file   Social Determinants of Health   Financial Resource Strain: Not on file  Food Insecurity: Not on file  Transportation Needs: Not on file  Physical Activity: Not on file  Stress: Not on file  Social Connections: Not on file  Intimate Partner Violence: Not on file   Family History  Problem Relation Age of Onset   Cancer Mother        colon (6)   Stroke Paternal Grandfather       Review of Systems     Objective:   Physical Exam Vitals reviewed.  Constitutional:      General: He is not in acute distress.    Appearance: Normal appearance. He is normal weight. He is not ill-appearing, toxic-appearing or diaphoretic.  HENT:     Head: Normocephalic and atraumatic.     Right Ear: Tympanic membrane and ear canal normal. There is no impacted cerumen.     Left Ear: Tympanic membrane and ear canal normal. There is no impacted cerumen.  Nose: Nose normal. No congestion or rhinorrhea.     Mouth/Throat:     Mouth: Mucous membranes are moist.     Pharynx: Oropharynx is clear. No oropharyngeal exudate or posterior oropharyngeal erythema.  Eyes:     General:        Right eye: No discharge.        Left eye: No discharge.     Conjunctiva/sclera: Conjunctivae normal.  Neck:     Vascular: Normal carotid pulses. No carotid bruit.     Trachea: Trachea normal.   Cardiovascular:     Rate and Rhythm: Normal rate and regular rhythm.     Pulses: Normal pulses.     Heart sounds: Normal heart sounds. No murmur heard.    No friction rub. No gallop.  Pulmonary:     Effort: Pulmonary effort is normal. No respiratory distress.     Breath sounds: Normal breath sounds. No stridor. No wheezing, rhonchi or rales.  Abdominal:     General: Abdomen  is flat. Bowel sounds are normal. There is no distension.     Palpations: Abdomen is soft.     Tenderness: There is no abdominal tenderness. There is no guarding or rebound.     Hernia: No hernia is present.  Musculoskeletal:        General: No swelling, tenderness, deformity or signs of injury.     Cervical back: Normal range of motion and neck supple. No rigidity or tenderness. Muscular tenderness present. No pain with movement or spinous process tenderness.     Right lower leg: No edema.     Left lower leg: No edema.  Lymphadenopathy:     Cervical: No cervical adenopathy.  Skin:    General: Skin is warm.     Coloration: Skin is not jaundiced or pale.     Findings: No bruising, erythema, lesion or rash.  Neurological:     General: No focal deficit present.     Mental Status: He is alert and oriented to person, place, and time. Mental status is at baseline.     Cranial Nerves: No cranial nerve deficit.     Sensory: No sensory deficit.     Motor: No weakness.     Coordination: Coordination normal.     Gait: Gait normal.     Deep Tendon Reflexes: Reflexes normal.           Assessment & Plan:   Colon cancer screening - Plan: Ambulatory referral to Gastroenterology  Neck pain Patient's headache has subsided.  I believe that he likely had a cervical muscle strain in his neck or perhaps a herniated disc in his neck causing a neurogenic headache.  The headache has improved as the neck pain has improved.  At this point he is headache free.  There is some mild residual pain in the right side of his neck but he declines any medication for it.  We discussed imaging of the neck such as an x-ray but he defers this at the present time as the pain is getting better.  He would asked that I schedule him for colonoscopy as he is overdue for colon cancer screening

## 2022-09-22 ENCOUNTER — Encounter: Payer: Self-pay | Admitting: *Deleted

## 2022-09-27 ENCOUNTER — Encounter (HOSPITAL_COMMUNITY): Payer: Self-pay

## 2022-09-27 ENCOUNTER — Other Ambulatory Visit: Payer: Self-pay

## 2022-09-27 ENCOUNTER — Emergency Department (HOSPITAL_COMMUNITY)
Admission: EM | Admit: 2022-09-27 | Discharge: 2022-09-27 | Disposition: A | Discharge status: Home / Self Care | Payer: BC Managed Care – PPO | Attending: Emergency Medicine | Admitting: Emergency Medicine

## 2022-09-27 ENCOUNTER — Emergency Department (HOSPITAL_COMMUNITY): Payer: BC Managed Care – PPO

## 2022-09-27 DIAGNOSIS — R109 Unspecified abdominal pain: Secondary | ICD-10-CM | POA: Diagnosis present

## 2022-09-27 DIAGNOSIS — N132 Hydronephrosis with renal and ureteral calculous obstruction: Secondary | ICD-10-CM | POA: Insufficient documentation

## 2022-09-27 DIAGNOSIS — N200 Calculus of kidney: Secondary | ICD-10-CM

## 2022-09-27 DIAGNOSIS — D72829 Elevated white blood cell count, unspecified: Secondary | ICD-10-CM | POA: Diagnosis not present

## 2022-09-27 LAB — BASIC METABOLIC PANEL WITH GFR
Anion gap: 8 (ref 5–15)
BUN: 15 mg/dL (ref 6–20)
CO2: 27 mmol/L (ref 22–32)
Calcium: 9 mg/dL (ref 8.9–10.3)
Chloride: 102 mmol/L (ref 98–111)
Creatinine, Ser: 1.07 mg/dL (ref 0.61–1.24)
GFR, Estimated: >60 mL/min
Glucose, Bld: 133 mg/dL — ABNORMAL HIGH (ref 70–99)
Potassium: 3.7 mmol/L (ref 3.5–5.1)
Sodium: 137 mmol/L (ref 135–145)

## 2022-09-27 LAB — CBC
HCT: 37.7 % — ABNORMAL LOW (ref 39.0–52.0)
Hemoglobin: 13 g/dL (ref 13.0–17.0)
MCH: 31.4 pg (ref 26.0–34.0)
MCHC: 34.5 g/dL (ref 30.0–36.0)
MCV: 91.1 fL (ref 80.0–100.0)
Platelets: 165 10*3/uL (ref 150–400)
RBC: 4.14 MIL/uL — ABNORMAL LOW (ref 4.22–5.81)
RDW: 12.8 % (ref 11.5–15.5)
WBC: 10.6 10*3/uL — ABNORMAL HIGH (ref 4.0–10.5)
nRBC: 0 % (ref 0.0–0.2)

## 2022-09-27 LAB — URINALYSIS, ROUTINE W REFLEX MICROSCOPIC
Bilirubin Urine: NEGATIVE
Glucose, UA: NEGATIVE mg/dL
Ketones, ur: 5 mg/dL — AB
Leukocytes,Ua: NEGATIVE
Nitrite: NEGATIVE
Protein, ur: 30 mg/dL — AB
RBC / HPF: >50 RBC/hpf (ref 0–5)
Specific Gravity, Urine: 1.028 (ref 1.005–1.030)
pH: 5 (ref 5.0–8.0)

## 2022-09-27 MED ORDER — SULFAMETHOXAZOLE-TRIMETHOPRIM 800-160 MG PO TABS
1.0000 | ORAL_TABLET | Freq: Once | ORAL | Status: AC
  Administered 2022-09-27: 1 via ORAL
  Filled 2022-09-27: qty 1

## 2022-09-27 MED ORDER — ONDANSETRON HCL 4 MG/2ML IJ SOLN
4.0000 mg | Freq: Once | INTRAMUSCULAR | Status: AC
  Administered 2022-09-27: 4 mg via INTRAVENOUS
  Filled 2022-09-27: qty 2

## 2022-09-27 MED ORDER — OXYCODONE-ACETAMINOPHEN 5-325 MG PO TABS: 1.0000 | ORAL_TABLET | ORAL | 0 refills | Status: DC | PRN

## 2022-09-27 MED ORDER — SODIUM CHLORIDE 0.9 % IV BOLUS
1000.0000 mL | Freq: Once | INTRAVENOUS | Status: AC
  Administered 2022-09-27: 1000 mL via INTRAVENOUS

## 2022-09-27 MED ORDER — SULFAMETHOXAZOLE-TRIMETHOPRIM 800-160 MG PO TABS: 1.0000 | ORAL_TABLET | Freq: Two times a day (BID) | ORAL | 0 refills | Status: DC

## 2022-09-27 MED ORDER — TAMSULOSIN HCL 0.4 MG PO CAPS: 0.4000 mg | ORAL_CAPSULE | Freq: Every day | ORAL | 0 refills | Status: DC

## 2022-09-27 MED ORDER — OXYCODONE-ACETAMINOPHEN 5-325 MG PO TABS: 1.0000 | ORAL_TABLET | Freq: Four times a day (QID) | ORAL | 0 refills | Status: DC | PRN

## 2022-09-27 MED ORDER — KETOROLAC TROMETHAMINE 30 MG/ML IJ SOLN
15.0000 mg | Freq: Once | INTRAMUSCULAR | Status: AC
  Administered 2022-09-27: 15 mg via INTRAVENOUS
  Filled 2022-09-27: qty 1

## 2022-09-27 MED ORDER — OXYCODONE-ACETAMINOPHEN 5-325 MG PO TABS
1.0000 | ORAL_TABLET | Freq: Once | ORAL | Status: AC
  Administered 2022-09-27: 1 via ORAL
  Filled 2022-09-27: qty 1

## 2022-09-27 MED ORDER — HYDROMORPHONE HCL 1 MG/ML IJ SOLN
1.0000 mg | Freq: Once | INTRAMUSCULAR | Status: AC
  Administered 2022-09-27: 1 mg via INTRAVENOUS
  Filled 2022-09-27: qty 1

## 2022-09-27 NOTE — ED Notes (Signed)
Pt reminded of need for urine sample. Pt states he cannot urinate at this time.

## 2022-09-27 NOTE — ED Notes (Signed)
Pt given a urine strainer, educated on how to use, pt verbalized understanding. Pt reports does not feel urge to urinate, but would like some additional pain medication, advised will notify the provider.

## 2022-09-27 NOTE — ED Notes (Signed)
Patient transported to CT 

## 2022-09-27 NOTE — Discharge Instructions (Addendum)
Take the medications prescribed for pain, you also have a few bacteria in your urine so you are also being placed on an antibiotic to make sure that this does not turn into a bigger infection.  Make sure you are drinking plenty of fluids.  You are also being prescribed a medicine called Flomax which can help the stone pass quicker because it relaxes the ureter which is the tube this stone is currently trying to traverse.  You are being referred to a urologist for further evaluation and management of this kidney stone.  In the interim strain your urine so you will know if you passed the stone, it is quite tiny at 2 mm but large enough to cause the kind of pain you are having.  If you develop uncontrolled pain, uncontrolled vomiting or fevers return to the emergency room right away.

## 2022-09-27 NOTE — ED Triage Notes (Signed)
Pt reports right side flank pain since this morning, hx of kidney stones.

## 2022-09-27 NOTE — ED Notes (Signed)
Verbal order from [employer] to order oxyCODONE-acetaminophen 5-325 mg then pt may be d/c home with pre-pack

## 2022-09-28 ENCOUNTER — Other Ambulatory Visit: Payer: Self-pay

## 2022-09-28 DIAGNOSIS — N200 Calculus of kidney: Secondary | ICD-10-CM

## 2022-09-28 MED FILL — Oxycodone w/ Acetaminophen Tab 5-325 MG: ORAL | Qty: 6 | Status: AC

## 2022-09-28 NOTE — ED Provider Notes (Signed)
[facility] Provider Note   CSN: [id number] Arrival date & time: 09/27/22  1610     History  Chief Complaint  Patient presents with   Flank Pain    Wesley Middleton is a 47 y.o. male.  The history is provided by the patient and the spouse.  Flank Pain This is a recurrent (reports kidney stone many years ago) problem. The current episode started 3 to 5 hours ago. The problem occurs constantly. The problem has not changed since onset.Pertinent negatives include no chest pain, no abdominal pain and no shortness of breath. Associated symptoms comments: He denies dysuria, hematuria, fever.  He has emesis when pain is severe.. Nothing aggravates the symptoms. Nothing relieves the symptoms. He has tried nothing for the symptoms.       Home Medications Prior to Admission medications   Medication Sig Start Date End Date Taking? Authorizing Provider  oxyCODONE-acetaminophen (PERCOCET/ROXICET) 5-325 MG tablet Take 1 tablet by mouth every 4 (four) hours as needed. 09/27/22  Yes Tia Hieronymus, Almyra Free, PA-C  oxyCODONE-acetaminophen (PERCOCET/ROXICET) 5-325 MG tablet Take 1 tablet by mouth every 6 (six) hours as needed for severe pain. 09/27/22  Yes Ezrah Dembeck, Almyra Free, PA-C  sulfamethoxazole-trimethoprim (BACTRIM DS) 800-160 MG tablet Take 1 tablet by mouth 2 (two) times daily for 7 days. 09/27/22 10/04/22 Yes Karleen Seebeck, Almyra Free, PA-C  tamsulosin (FLOMAX) 0.4 MG CAPS capsule Take 1 capsule (0.4 mg total) by mouth daily after supper. 09/27/22  Yes Trystyn Dolley, Almyra Free, PA-C      Allergies    Ceclor [cefaclor] and Penicillins    Review of Systems   Review of Systems  Constitutional:  Negative for chills and fever.  Respiratory:  Negative for shortness of breath.   Cardiovascular:  Negative for chest pain.  Gastrointestinal:  Positive for nausea and vomiting. Negative for abdominal pain.  Genitourinary:  Positive for flank pain. Negative for dysuria and hematuria.  All other systems  reviewed and are negative.   Physical Exam Updated Vital Signs BP 128/71   Pulse 72   Temp 97.7 F (36.5 C) (Oral)   Resp 17   Ht 5\' 10"  (1.778 m)   Wt 70.3 kg   SpO2 96%   BMI 22.24 kg/m  Physical Exam Vitals and nursing note reviewed.  Constitutional:      Appearance: He is well-developed.  HENT:     Head: Normocephalic and atraumatic.  Eyes:     Conjunctiva/sclera: Conjunctivae normal.  Cardiovascular:     Rate and Rhythm: Normal rate and regular rhythm.     Heart sounds: Normal heart sounds.  Pulmonary:     Effort: Pulmonary effort is normal.     Breath sounds: Normal breath sounds. No wheezing.  Abdominal:     General: Bowel sounds are normal.     Palpations: Abdomen is soft.     Tenderness: There is no abdominal tenderness. There is no right CVA tenderness, left CVA tenderness, guarding or rebound.     Comments: Pain right cva/flank radiating to right lateral abdomen above pelvic rim, not reproducible on exam.  No guarding or abd distention.  Musculoskeletal:        General: Normal range of motion.     Cervical back: Normal range of motion.  Skin:    General: Skin is warm and dry.  Neurological:     General: No focal deficit present.     Mental Status: He is alert.     ED Results / Procedures / Treatments  Labs (all labs ordered are listed, but only abnormal results are displayed) Labs Reviewed  URINALYSIS, ROUTINE W REFLEX MICROSCOPIC - Abnormal; Notable for the following components:      Result Value   APPearance HAZY (*)    Hgb urine dipstick LARGE (*)    Ketones, ur 5 (*)    Protein, ur 30 (*)    Bacteria, UA RARE (*)    All other components within normal limits  BASIC METABOLIC PANEL - Abnormal; Notable for the following components:   Glucose, Bld 133 (*)    All other components within normal limits  CBC - Abnormal; Notable for the following components:   WBC 10.6 (*)    RBC 4.14 (*)    HCT 37.7 (*)    All other components within normal  limits    EKG None  Radiology CT Renal Stone Study  Result Date: 09/27/2022 CLINICAL DATA:  Right-sided flank pain with nausea and vomiting since this a.m. History of renal stones. EXAM: CT ABDOMEN AND PELVIS WITHOUT CONTRAST TECHNIQUE: Multidetector CT imaging of the abdomen and pelvis was performed following the standard protocol without IV contrast. RADIATION DOSE REDUCTION: This exam was performed according to the departmental dose-optimization program which includes automated exposure control, adjustment of the mA and/or kV according to patient size and/or use of iterative reconstruction technique. COMPARISON:  No recent relevant priors available for comparison at time of dictation. FINDINGS: Lower chest: No acute abnormality. Hepatobiliary: Unremarkable noncontrast enhanced appearance of the hepatic parenchyma. Gallbladder is unremarkable. No biliary ductal dilation. Pancreas: No pancreatic ductal dilation or evidence of acute inflammation. Spleen: No splenomegaly. Adrenals/Urinary Tract: Bilateral adrenal glands are within normal limits. Edematous appearance of the right kidney with mild hydroureteronephrosis to a 2 mm stone in the distal ureter 1 cm before the ureterovesicular junction. No left-sided hydronephrosis or nephrolithiasis. No additional right-sided renal stones. Urinary bladder is unremarkable for degree of distension. Stomach/Bowel: Stomach is unremarkable for degree of distension no pathologic dilation of small or large bowel. Normal appendix. No evidence of acute bowel inflammation. Vascular/Lymphatic: Normal caliber abdominal aorta. Minimal scattered aortic atherosclerosis. Smooth IVC contours. No pathologically enlarged abdominal or pelvic lymph nodes. Reproductive: Enlarged prostate gland. Other: No significant abdominal free fluid. Musculoskeletal: No acute osseous abnormality IMPRESSION: 1. Edematous appearance of the right kidney with mild hydroureteronephrosis to a 2 mm stone in  the distal ureter 1 cm before the ureterovesicular junction. 2. Enlarged prostate gland. Electronically Signed   By: Dahlia Bailiff M.D.   On: 09/27/2022 18:44    Procedures Procedures    Medications Ordered in ED Medications  sodium chloride 0.9 % bolus 1,000 mL (0 mLs Intravenous Stopped 09/27/22 1932)  HYDROmorphone (DILAUDID) injection 1 mg (1 mg Intravenous Given 09/27/22 1807)  ondansetron (ZOFRAN) injection 4 mg (4 mg Intravenous Given 09/27/22 1807)  sulfamethoxazole-trimethoprim (BACTRIM DS) 800-160 MG per tablet 1 tablet (1 tablet Oral Given 09/27/22 2042)  ketorolac (TORADOL) 30 MG/ML injection 15 mg (15 mg Intravenous Given 09/27/22 2042)  oxyCODONE-acetaminophen (PERCOCET/ROXICET) 5-325 MG per tablet 1 tablet (1 tablet Oral Given 09/27/22 2104)    ED Course/ Medical Decision Making/ A&P                             Medical Decision Making Patient presenting with sudden onset right flank pain, nausea and vomiting, prior history of kidney stones, exam and history most suggestive of this condition.  Differential diagnosis also including pyelonephritis, UTI,  biliary colic, right lower pneumonia.  CT imaging confirming a 2 mm stone at the distal ureter.  UA also reflects rare bacteria, large amount of hemoglobin.  He has been covered with antibiotics and strict return precautions are given for development of any fever, uncontrolled vomiting or worsening pain.  He is given a urine strainer, also prescribed Flomax and Percocet.  Referral given to urology for follow-up care.  Amount and/or Complexity of Data Reviewed Labs: ordered.    Details: Kidney function is reassuring, he has borderline leukocytosis at 10.6.  He has rare bacteria in his urine, this was a clean-catch specimen Radiology: ordered.    Details: CT imaging reviewed, agree with interpretation, 2 mm stone right distal ureter.  Risk Prescription drug management.           Final Clinical Impression(s) / ED  Diagnoses Final diagnoses:  Kidney stone    Rx / DC Orders ED Discharge Orders          Ordered    oxyCODONE-acetaminophen (PERCOCET/ROXICET) 5-325 MG tablet  Every 4 hours PRN        09/27/22 2031    sulfamethoxazole-trimethoprim (BACTRIM DS) 800-160 MG tablet  2 times daily        09/27/22 2031    tamsulosin (FLOMAX) 0.4 MG CAPS capsule  Daily after supper        09/27/22 2031    oxyCODONE-acetaminophen (PERCOCET/ROXICET) 5-325 MG tablet  Every 6 hours PRN        09/27/22 2045              Evalee Jefferson, Hershal Coria 09/28/22 1210    Hayden Rasmussen, MD 09/28/22 1250

## 2022-09-29 ENCOUNTER — Ambulatory Visit (INDEPENDENT_AMBULATORY_CARE_PROVIDER_SITE_OTHER): Payer: BC Managed Care – PPO | Admitting: Urology

## 2022-09-29 ENCOUNTER — Ambulatory Visit (HOSPITAL_COMMUNITY)
Admission: RE | Admit: 2022-09-29 | Discharge: 2022-09-29 | Disposition: A | Discharge status: Home / Self Care | Payer: BC Managed Care – PPO | Source: Ambulatory Visit | Attending: Urology | Admitting: Urology

## 2022-09-29 ENCOUNTER — Encounter: Payer: Self-pay | Admitting: Urology

## 2022-09-29 VITALS — BP 119/78 | HR 68 | Ht 70.0 in | Wt 155.0 lb

## 2022-09-29 DIAGNOSIS — Z87442 Personal history of urinary calculi: Secondary | ICD-10-CM | POA: Diagnosis not present

## 2022-09-29 DIAGNOSIS — N200 Calculus of kidney: Secondary | ICD-10-CM

## 2022-09-29 DIAGNOSIS — N401 Enlarged prostate with lower urinary tract symptoms: Secondary | ICD-10-CM

## 2022-09-29 DIAGNOSIS — R3912 Poor urinary stream: Secondary | ICD-10-CM | POA: Diagnosis not present

## 2022-09-29 DIAGNOSIS — N138 Other obstructive and reflux uropathy: Secondary | ICD-10-CM

## 2022-09-29 LAB — URINALYSIS, ROUTINE W REFLEX MICROSCOPIC
Bilirubin, UA: NEGATIVE
Glucose, UA: NEGATIVE
Leukocytes,UA: NEGATIVE
Nitrite, UA: NEGATIVE
Specific Gravity, UA: 1.02 (ref 1.005–1.030)
Urobilinogen, Ur: 0.2 mg/dL (ref 0.2–1.0)
pH, UA: 5.5 (ref 5.0–7.5)

## 2022-09-29 LAB — MICROSCOPIC EXAMINATION: Bacteria, UA: NONE SEEN

## 2022-09-29 MED ORDER — TAMSULOSIN HCL 0.4 MG PO CAPS: 0.4000 mg | ORAL_CAPSULE | Freq: Every day | ORAL | 11 refills | Status: DC

## 2022-09-29 NOTE — Progress Notes (Signed)
Subjective: 1. Kidney stones   2. BPH with urinary obstruction   3. Weak urinary stream      Consult requested by ER  Wesley Middleton is a 47 yo male who was seen by Dr. Terance Hart in 2010.  He had a prior bilateral vasectomy and left varicocelectomy in 2000 and was found to have a right hydrocele in 2010 on [place].  He is seen today for a 84m right distal ureteral stone found on CT at an ER visit for right flank pain in 09/27/22.  He UA had >50 RBC's and 6-10 WBC. He had a mild leukocytosis and mildly elevated glucose.   He had a KUB on 09/29/22 and the stone is possibly visible but not clearly so.  He passed the stone yesterday and his symptoms have resolved.  He had some urgency and frequency but that has improved.  He had no hematuria. He was given bactrim but hasn't taken it.   This was his first stone.  He still has 11-30 RBC's.   He also has some chronic LUTS with a reduced stream and did benefit from the tamsulosin.   He last had a PSA in 2/23 and it was 1.49 which was stable since 2019.    ROS:  ROS  Allergies  Allergen Reactions   Ceclor [Cefaclor] Other (See Comments)    Pt had reaction as a child   Penicillins Other (See Comments)    Had reaction as child    History reviewed. No pertinent past medical history.  Past Surgical History:  Procedure Laterality Date   VARICOCELECTOMY     left testicle    Social History   Socioeconomic History   Marital status: Married    Spouse name: Not on file   Number of children: Not on file   Years of education: Not on file   Highest education level: Not on file  Occupational History   Not on file  Tobacco Use   Smoking status: Some Days    Types: Cigarettes   Smokeless tobacco: Current    Types: Snuff  Vaping Use   Vaping Use: Never used  Substance and Sexual Activity   Alcohol use: Yes    Alcohol/week: 0.0 standard drinks of alcohol    Comment: Occasionally   Drug use: No   Sexual activity: Not on file  Other Topics Concern    Not on file  Social History Narrative   Not on file   Social Determinants of Health   Financial Resource Strain: Not on file  Food Insecurity: Not on file  Transportation Needs: Not on file  Physical Activity: Not on file  Stress: Not on file  Social Connections: Not on file  Intimate Partner Violence: Not on file    Family History  Problem Relation Age of Onset   Cancer Mother        colon (659   Stroke Paternal Grandfather     Anti-infectives: Anti-infectives (From admission, onward)    None       Current Outpatient Medications  Medication Sig Dispense Refill   oxyCODONE-acetaminophen (PERCOCET/ROXICET) 5-325 MG tablet Take 1 tablet by mouth every 4 (four) hours as needed. 20 tablet 0   oxyCODONE-acetaminophen (PERCOCET/ROXICET) 5-325 MG tablet Take 1 tablet by mouth every 6 (six) hours as needed for severe pain. 6 tablet 0   tamsulosin (FLOMAX) 0.4 MG CAPS capsule Take 1 capsule (0.4 mg total) by mouth daily after supper. 30 capsule 11   No current facility-administered medications for  this visit.     Objective: Vital signs in last 24 hours: BP 119/78   Pulse 68   Ht 5' 10"$  (1.778 m)   Wt 155 lb (70.3 kg)   BMI 22.24 kg/m   Intake/Output from previous day: No intake/output data recorded. Intake/Output this shift: @IOTHISSHIFT$ @   Physical Exam Vitals reviewed.  Constitutional:      Appearance: Normal appearance.  Cardiovascular:     Rate and Rhythm: Normal rate and regular rhythm.     Heart sounds: Normal heart sounds.  Pulmonary:     Effort: Pulmonary effort is normal. No respiratory distress.     Breath sounds: Normal breath sounds.  Abdominal:     General: Abdomen is flat.     Palpations: Abdomen is soft.     Tenderness: There is right CVA tenderness.     Hernia: No hernia is present.  Genitourinary:    Comments: Normal phallus with adequate meatus. Scrotum, testes and epididymis nl except for possible small right upper  spermatocele. AP/NST without mass. Prostate 1.5+ benign. SV normal.  Musculoskeletal:        General: No swelling. Normal range of motion.     Cervical back: Normal range of motion and neck supple.  Skin:    General: Skin is warm and dry.  Neurological:     General: No focal deficit present.     Mental Status: He is alert and oriented to person, place, and time.  Psychiatric:        Mood and Affect: Mood normal.        Behavior: Behavior normal.     Lab Results:  Results for orders placed or performed in visit on 09/29/22 (from the past 24 hour(s))  Urinalysis, Routine w reflex microscopic     Status: Abnormal   Collection Time: 09/29/22 10:33 AM  Result Value Ref Range   Specific Gravity, UA 1.020 1.005 - 1.030   pH, UA 5.5 5.0 - 7.5   Color, UA Yellow Yellow   Appearance Ur Clear Clear   Leukocytes,UA Negative Negative   Protein,UA Trace Negative/Trace   Glucose, UA Negative Negative   Ketones, UA Trace (A) Negative   RBC, UA 2+ (A) Negative   Bilirubin, UA Negative Negative   Urobilinogen, Ur 0.2 0.2 - 1.0 mg/dL   Nitrite, UA Negative Negative   Microscopic Examination See below:    Narrative   Performed at:  [facility] [address], [place], [place]  [id number] Lab Director: Mina Marble MT, Phone:  [phone]  Microscopic Examination     Status: Abnormal   Collection Time: 09/29/22 10:33 AM   Urine  Result Value Ref Range   WBC, UA 0-5 0 - 5 /hpf   RBC, Urine 11-30 (A) 0 - 2 /hpf   Epithelial Cells (non renal) 0-10 0 - 10 /hpf   Mucus, UA Present (A) Not Estab.   Bacteria, UA None seen None seen/Few   Narrative   Performed at:  [facility] [address] [place], [place]  [id number] Lab Director: Mina Marble MT, Phone:  [phone]    BMET Recent Labs    09/27/22 1756  NA 137  K 3.7  CL 102  CO2 27  GLUCOSE 133*  BUN 15  CREATININE 1.07  CALCIUM 9.0   Lab Results  Component Value Date   PSA 1.49 10/05/2021    PSA 1.4 07/03/2018   UA has 11-30 RBC's  PT/INR No results for input(s): "LABPROT", "  INR" in the last 72 hours. ABG No results for input(s): "PHART", "HCO3" in the last 72 hours.  Invalid input(s): "PCO2", "PO2"  Studies/Results: DG Abd 1 View  Result Date: 09/29/2022 CLINICAL DATA:  Kidney stone.  Right flank pain for 2 days. EXAM: ABDOMEN - 1 VIEW COMPARISON:  Abdominopelvic CT 09/27/2022. FINDINGS: The bowel gas pattern is normal. There is no supine evidence of free intraperitoneal air. Questionable visualization of the previously demonstrated tiny calculus at the right ureterovesical junction. No other calcifications are seen over the kidneys or expected course of the ureters. The bones appear unremarkable. IMPRESSION: Questionable visualization of the previously demonstrated tiny calculus at the right ureterovesical junction. Electronically Signed   By: Richardean Sale M.D.   On: 09/29/2022 09:59    ED notes reviewed.  Assessment/Plan: Right ureteral stone.  He passed the stone yesterday but still has some tenderness and hematuria.   I will get the stone analyzed and will give him dietary instruction.     BPH with BOO and moderate/severe LUTS.  He noticed a benefit with tamsulosin.  I will refill that and have him return in a month for a flowrate and PVR.   Meds ordered this encounter  Medications   tamsulosin (FLOMAX) 0.4 MG CAPS capsule    Sig: Take 1 capsule (0.4 mg total) by mouth daily after supper.    Dispense:  30 capsule    Refill:  11     Orders Placed This Encounter  Procedures   Microscopic Examination   Urinalysis, Routine w reflex microscopic   Calculi, with Photograph (to Clinical Lab)     Return in about 4 weeks (around 10/27/2022) for for flowrate and PVR. Marland Kitchen    CC: Dr. Jenna Luo.      Irine Seal 09/30/2022

## 2022-09-30 ENCOUNTER — Telehealth: Payer: Self-pay | Admitting: *Deleted

## 2022-09-30 NOTE — Telephone Encounter (Signed)
  Procedure: Colonoscopy  Height: 5 11 Weight: 155lbs       Have you had a colonoscopy before?  no  Do you have family history of colon cancer?  Yes, mother  Do you have a family history of polyps? yes  Previous colonoscopy with polyps removed? no  Do you have a history colorectal cancer?   no  Are you diabetic?  no  Do you have a prosthetic or mechanical heart valve? no  Do you have a pacemaker/defibrillator?   no  Have you had endocarditis/atrial fibrillation?  no  Do you use supplemental oxygen/CPAP?  no  Have you had joint replacement within the last 12 months?  no  Do you tend to be constipated or have to use laxatives?  no   Do you have history of alcohol use? If yes, how much and how often.  no  Do you have history or are you using drugs? If yes, what do are you  using?  no  Have you ever had a stroke/heart attack?  no  Have you ever had a heart or other vascular stent placed,?no  Do you take weight loss medication? no  Do you take any blood-thinning medications such as: (Plavix, aspirin, Coumadin, Aggrenox, Brilinta, Xarelto, Eliquis, Pradaxa, Savaysa or Effient)? no  If yes we need the name, milligram, dosage and who is prescribing doctor:               Current Outpatient Medications  Medication Sig Dispense Refill   oxyCODONE-acetaminophen (PERCOCET/ROXICET) 5-325 MG tablet Take 1 tablet by mouth every 4 (four) hours as needed. 20 tablet 0   oxyCODONE-acetaminophen (PERCOCET/ROXICET) 5-325 MG tablet Take 1 tablet by mouth every 6 (six) hours as needed for severe pain. 6 tablet 0   tamsulosin (FLOMAX) 0.4 MG CAPS capsule Take 1 capsule (0.4 mg total) by mouth daily after supper. 30 capsule 11   No current facility-administered medications for this visit.    Allergies  Allergen Reactions   Ceclor [Cefaclor] Other (See Comments)    Pt had reaction as a child   Penicillins Other (See Comments)    Had reaction as child

## 2022-10-07 LAB — CALCULI, WITH PHOTOGRAPH (CLINICAL LAB)
Calcium Oxalate Dihydrate: 20 %
Calcium Oxalate Monohydrate: 80 %
Weight Calculi: 7 mg

## 2022-10-11 NOTE — Telephone Encounter (Signed)
Appropriate ASA  2.

## 2022-10-11 NOTE — Telephone Encounter (Signed)
LMTRC

## 2022-10-12 NOTE — Telephone Encounter (Signed)
Pt left vm to schedule procedure.  Wesley Middleton

## 2022-10-17 ENCOUNTER — Telehealth: Payer: Self-pay | Admitting: Internal Medicine

## 2022-10-17 NOTE — Telephone Encounter (Signed)
Patient left message over the weekend that he was returning our call.

## 2022-10-19 ENCOUNTER — Encounter: Payer: Self-pay | Admitting: *Deleted

## 2022-10-19 ENCOUNTER — Other Ambulatory Visit: Payer: Self-pay | Admitting: *Deleted

## 2022-10-19 MED ORDER — PEG 3350-KCL-NA BICARB-NACL 420 G PO SOLR: 4000.0000 mL | Freq: Once | ORAL | 0 refills | Status: AC

## 2022-10-19 NOTE — Telephone Encounter (Signed)
Pt has been scheduled for 11/01/22 with Dr.Carver, instructions sent via MyChart and prep sent to the pharmacy.

## 2022-10-19 NOTE — Telephone Encounter (Signed)
Pt has been scheduled for 11/01/22 with Dr.Carver. Instructions sent to pt via MyChart and prep sent to the pharmacy

## 2022-11-01 ENCOUNTER — Encounter (HOSPITAL_COMMUNITY): Payer: Self-pay

## 2022-11-01 ENCOUNTER — Ambulatory Visit (HOSPITAL_COMMUNITY): Payer: BC Managed Care – PPO | Admitting: Anesthesiology

## 2022-11-01 ENCOUNTER — Encounter (HOSPITAL_COMMUNITY)
Admission: RE | Disposition: A | Discharge status: Home / Self Care | Payer: Self-pay | Source: Home / Self Care | Attending: Internal Medicine

## 2022-11-01 ENCOUNTER — Other Ambulatory Visit: Payer: Self-pay

## 2022-11-01 ENCOUNTER — Ambulatory Visit (HOSPITAL_COMMUNITY)
Admission: RE | Admit: 2022-11-01 | Discharge: 2022-11-01 | Disposition: A | Discharge status: Home / Self Care | Payer: BC Managed Care – PPO | Attending: Internal Medicine | Admitting: Internal Medicine

## 2022-11-01 DIAGNOSIS — D125 Benign neoplasm of sigmoid colon: Secondary | ICD-10-CM

## 2022-11-01 DIAGNOSIS — F17210 Nicotine dependence, cigarettes, uncomplicated: Secondary | ICD-10-CM | POA: Insufficient documentation

## 2022-11-01 DIAGNOSIS — F17220 Nicotine dependence, chewing tobacco, uncomplicated: Secondary | ICD-10-CM | POA: Insufficient documentation

## 2022-11-01 DIAGNOSIS — Z1211 Encounter for screening for malignant neoplasm of colon: Secondary | ICD-10-CM | POA: Diagnosis not present

## 2022-11-01 DIAGNOSIS — D120 Benign neoplasm of cecum: Secondary | ICD-10-CM | POA: Diagnosis not present

## 2022-11-01 DIAGNOSIS — K635 Polyp of colon: Secondary | ICD-10-CM | POA: Diagnosis not present

## 2022-11-01 DIAGNOSIS — Z800 Family history of malignant neoplasm of digestive organs: Secondary | ICD-10-CM | POA: Diagnosis not present

## 2022-11-01 HISTORY — PX: POLYPECTOMY: SHX5525

## 2022-11-01 HISTORY — PX: COLONOSCOPY WITH PROPOFOL: SHX5780

## 2022-11-01 HISTORY — DX: Calculus of kidney: N20.0

## 2022-11-01 SURGERY — COLONOSCOPY WITH PROPOFOL
Anesthesia: General

## 2022-11-01 MED ORDER — PROPOFOL 10 MG/ML IV BOLUS
INTRAVENOUS | Status: DC | PRN
  Administered 2022-11-01: 150 mg via INTRAVENOUS
  Administered 2022-11-01: 40 mg via INTRAVENOUS

## 2022-11-01 MED ORDER — LACTATED RINGERS IV SOLN: INTRAVENOUS | Status: DC

## 2022-11-01 MED ORDER — PROPOFOL 500 MG/50ML IV EMUL
INTRAVENOUS | Status: DC | PRN
  Administered 2022-11-01: 125 ug/kg/min via INTRAVENOUS

## 2022-11-01 MED ORDER — STERILE WATER FOR IRRIGATION IR SOLN
Status: DC | PRN
  Administered 2022-11-01: 50 mL

## 2022-11-01 NOTE — Anesthesia Preprocedure Evaluation (Signed)
Anesthesia Evaluation  Patient identified by MRN, date of birth, ID band Patient awake    Reviewed: Allergy & Precautions, H&P , NPO status , Patient's Chart, lab work & pertinent test results, reviewed documented beta blocker date and time   Airway Mallampati: II  TM Distance: >3 FB Neck ROM: full    Dental no notable dental hx.    Pulmonary neg pulmonary ROS, Current Smoker   Pulmonary exam normal breath sounds clear to auscultation       Cardiovascular Exercise Tolerance: Good negative cardio ROS  Rhythm:regular Rate:Normal     Neuro/Psych negative neurological ROS  negative psych ROS   GI/Hepatic negative GI ROS, Neg liver ROS,,,  Endo/Other  negative endocrine ROS    Renal/GU Renal diseasenegative Renal ROS  negative genitourinary   Musculoskeletal   Abdominal   Peds  Hematology negative hematology ROS (+)   Anesthesia Other Findings   Reproductive/Obstetrics negative OB ROS                             Anesthesia Physical Anesthesia Plan  ASA: 2  Anesthesia Plan: General   Post-op Pain Management:    Induction:   PONV Risk Score and Plan: Propofol infusion  Airway Management Planned:   Additional Equipment:   Intra-op Plan:   Post-operative Plan:   Informed Consent: I have reviewed the patients History and Physical, chart, labs and discussed the procedure including the risks, benefits and alternatives for the proposed anesthesia with the patient or authorized representative who has indicated his/her understanding and acceptance.     Dental Advisory Given  Plan Discussed with: CRNA  Anesthesia Plan Comments:        Anesthesia Quick Evaluation

## 2022-11-01 NOTE — Op Note (Signed)
[facility] Patient Name: Wesley Middleton Procedure Date: 11/01/2022 11:13 AM MRN: [id number] Date of Birth: 1975/08/24 Attending MD: Elon Alas. Abbey Chatters , [place], [id number] CSN: [id number] Age: 47 Admit Type: Outpatient Procedure:                Colonoscopy Indications:              Colon cancer screening in patient at increased                            risk: Colorectal cancer in mother Providers:                Elon Alas. Abbey Chatters, DO, Charlsie Quest. Theda Sers RN, RN,                            Randa Spike, Technician Referring MD:              Medicines:                See the Anesthesia note for documentation of the                            administered medications Complications:            No immediate complications. Estimated Blood Loss:     Estimated blood loss was minimal. Procedure:                Pre-Anesthesia Assessment:                           - The anesthesia plan was to use monitored                            anesthesia care (MAC).                           After obtaining informed consent, the colonoscope                            was passed under direct vision. Throughout the                            procedure, the patient's blood pressure, pulse, and                            oxygen saturations were monitored continuously. The                            [id number] scope was introduced through                            the anus and advanced to the the cecum, identified                            by appendiceal orifice and ileocecal valve. The                            colonoscopy was  performed without difficulty. The                            patient tolerated the procedure well. The quality                            of the bowel preparation was evaluated using the                            BBPS [facility] Bowel Preparation Scale) with scores                            of: Right Colon = 2 (minor amount of residual                             staining, small fragments of stool and/or opaque                            liquid, but mucosa seen well), Transverse Colon = 2                            (minor amount of residual staining, small fragments                            of stool and/or opaque liquid, but mucosa seen                            well) and Left Colon = 2 (minor amount of residual                            staining, small fragments of stool and/or opaque                            liquid, but mucosa seen well). The total BBPS score                            equals 6. Fair. Scope In: 11:29:43 AM Scope Out: 11:52:00 AM Scope Withdrawal Time: 0 hours 16 minutes 8 seconds  Total Procedure Duration: 0 hours 22 minutes 17 seconds  Findings:      Two sessile polyps were found in the cecum. The polyps were 3 to 5 mm in       size. These polyps were removed with a cold snare. Resection and       retrieval were complete.      A 5 mm polyp was found in the sigmoid colon. The polyp was sessile. The       polyp was removed with a cold snare. Resection and retrieval were       complete.      Liquid stool was found in the entire colon, making visualization       difficult. Lavage of the area was performed using copious amounts of       sterile water, resulting in clearance with fair visualization. Impression:               -  Two 3 to 5 mm polyps in the cecum, removed with a                            cold snare. Resected and retrieved.                           - One 5 mm polyp in the sigmoid colon, removed with                            a cold snare. Resected and retrieved.                           - Stool in the entire examined colon. Moderate Sedation:      Per Anesthesia Care Recommendation:           - Patient has a contact number available for                            emergencies. The signs and symptoms of potential                            delayed complications were discussed with the                             patient. Return to normal activities tomorrow.                            Written discharge instructions were provided to the                            patient.                           - Resume previous diet.                           - Continue present medications.                           - Await pathology results.                           - Repeat colonoscopy in 5 years for surveillance                            and family history of colon cancer.                           - Return to GI clinic PRN. Procedure Code(s):        --- Professional ---                           [phone], Colonoscopy, flexible; with removal of                            tumor(s),  polyp(s), or other lesion(s) by snare                            technique Diagnosis Code(s):        --- Professional ---                           Z80.0, Family history of malignant neoplasm of                            digestive organs                           D12.0, Benign neoplasm of cecum                           D12.5, Benign neoplasm of sigmoid colon CPT copyright 2022 American Medical Association. All rights reserved. The codes documented in this report are preliminary and upon coder review may  be revised to meet current compliance requirements. Elon Alas. Abbey Chatters, DO Yaurel Abbey Chatters, DO 11/01/2022 11:54:53 AM This report has been signed electronically. Number of Addenda: 0

## 2022-11-01 NOTE — Anesthesia Procedure Notes (Signed)
Date/Time: 11/01/2022 11:24 AM  Performed by: Vista Deck, CRNAPre-anesthesia Checklist: Patient identified, Emergency Drugs available, Suction available, Timeout performed and Patient being monitored Patient Re-evaluated:Patient Re-evaluated prior to induction Oxygen Delivery Method: Nasal Cannula

## 2022-11-01 NOTE — Discharge Instructions (Addendum)
°  Colonoscopy °Discharge Instructions ° °Read the instructions outlined below and refer to this sheet in the next few weeks. These discharge instructions provide you with general information on caring for yourself after you leave the hospital. Your doctor may also give you specific instructions. While your treatment has been planned according to the most current medical practices available, unavoidable complications occasionally occur.  ° °ACTIVITY °You may resume your regular activity, but move at a slower pace for the next 24 hours.  °Take frequent rest periods for the next 24 hours.  °Walking will help get rid of the air and reduce the bloated feeling in your belly (abdomen).  °No driving for 24 hours (because of the medicine (anesthesia) used during the test).   °Do not sign any important legal documents or operate any machinery for 24 hours (because of the anesthesia used during the test).  °NUTRITION °Drink plenty of fluids.  °You may resume your normal diet as instructed by your doctor.  °Begin with a light meal and progress to your normal diet. Heavy or fried foods are harder to digest and may make you feel sick to your stomach (nauseated).  °Avoid alcoholic beverages for 24 hours or as instructed.  °MEDICATIONS °You may resume your normal medications unless your doctor tells you otherwise.  °WHAT YOU CAN EXPECT TODAY °Some feelings of bloating in the abdomen.  °Passage of more gas than usual.  °Spotting of blood in your stool or on the toilet paper.  °IF YOU HAD POLYPS REMOVED DURING THE COLONOSCOPY: °No aspirin products for 7 days or as instructed.  °No alcohol for 7 days or as instructed.  °Eat a soft diet for the next 24 hours.  °FINDING OUT THE RESULTS OF YOUR TEST °Not all test results are available during your visit. If your test results are not back during the visit, make an appointment with your caregiver to find out the results. Do not assume everything is normal if you have not heard from your  caregiver or the medical facility. It is important for you to follow up on all of your test results.  °SEEK IMMEDIATE MEDICAL ATTENTION IF: °You have more than a spotting of blood in your stool.  °Your belly is swollen (abdominal distention).  °You are nauseated or vomiting.  °You have a temperature over 101.  °You have abdominal pain or discomfort that is severe or gets worse throughout the day.  ° °Your colonoscopy revealed 3 polyp(s) which I removed successfully. Await pathology results, my office will contact you. I recommend repeating colonoscopy in 5 years for surveillance purposes. Otherwise follow up with GI as needed.  ° ° °I hope you have a great rest of your week! ° °Charles K. Carver, D.O. °Gastroenterology and Hepatology °[facility] ° °

## 2022-11-01 NOTE — Anesthesia Postprocedure Evaluation (Signed)
Anesthesia Post Note  Patient: Wesley Middleton  Procedure(s) Performed: COLONOSCOPY WITH PROPOFOL POLYPECTOMY  Patient location during evaluation: Phase II Anesthesia Type: General Level of consciousness: awake Pain management: pain level controlled Vital Signs Assessment: post-procedure vital signs reviewed and stable Respiratory status: spontaneous breathing and respiratory function stable Cardiovascular status: blood pressure returned to baseline and stable Postop Assessment: no headache and no apparent nausea or vomiting Anesthetic complications: no Comments: Late entry   No notable events documented.   Last Vitals:  Vitals:   11/01/22 1157 11/01/22 1201  BP: 99/61 (!) 108/58  Pulse: (!) 55 (!) 56  Resp: 18 18  Temp: (!) 36.4 C   SpO2: 99% 99%    Last Pain:  Vitals:   11/01/22 1201  TempSrc:   PainSc: 0-No pain                 Louann Sjogren

## 2022-11-01 NOTE — H&P (Signed)
Primary Care Physician:  Susy Frizzle, MD Primary Gastroenterologist:  Dr. Abbey Chatters  Pre-Procedure History & Physical: HPI:  Wesley Middleton is a 47 y.o. male is here for a colonoscopy to be performed for high risk colon cancer screening purposes, family history of colon cancer in mother  Past Medical History:  Diagnosis Date   Kidney stones     Past Surgical History:  Procedure Laterality Date   FOOT SURGERY Left    HEEL DECUBITUS ULCER EXCISION     KNEE ARTHROSCOPY Bilateral    VARICOCELECTOMY     left testicle    Prior to Admission medications   Medication Sig Start Date End Date Taking? Authorizing Provider  oxyCODONE-acetaminophen (PERCOCET/ROXICET) 5-325 MG tablet Take 1 tablet by mouth every 4 (four) hours as needed. Patient not taking: Reported on 10/28/2022 09/27/22   Evalee Jefferson, PA-C  oxyCODONE-acetaminophen (PERCOCET/ROXICET) 5-325 MG tablet Take 1 tablet by mouth every 6 (six) hours as needed for severe pain. Patient not taking: Reported on 10/28/2022 09/27/22   Evalee Jefferson, PA-C  tamsulosin (FLOMAX) 0.4 MG CAPS capsule Take 1 capsule (0.4 mg total) by mouth daily after supper. Patient not taking: Reported on 10/28/2022 09/29/22   Irine Seal, MD    Allergies as of 10/19/2022 - Review Complete 09/29/2022  Allergen Reaction Noted   Ceclor [cefaclor] Other (See Comments) 12/07/2015   Penicillins Other (See Comments) 12/07/2015    Family History  Problem Relation Age of Onset   Cancer Mother        colon (38)   Stroke Paternal Grandfather     Social History   Socioeconomic History   Marital status: Married    Spouse name: Not on file   Number of children: Not on file   Years of education: Not on file   Highest education level: Not on file  Occupational History   Not on file  Tobacco Use   Smoking status: Some Days    Types: Cigarettes   Smokeless tobacco: Current    Types: Snuff  Vaping Use   Vaping Use: Never used  Substance and Sexual Activity    Alcohol use: Yes    Alcohol/week: 0.0 standard drinks of alcohol    Comment: Occasionally   Drug use: No   Sexual activity: Not on file  Other Topics Concern   Not on file  Social History Narrative   Not on file   Social Determinants of Health   Financial Resource Strain: Not on file  Food Insecurity: Not on file  Transportation Needs: Not on file  Physical Activity: Not on file  Stress: Not on file  Social Connections: Not on file  Intimate Partner Violence: Not on file    Review of Systems: See HPI, otherwise negative ROS  Physical Exam: Vital signs in last 24 hours: Temp:  [97.5 F (36.4 C)] 97.5 F (36.4 C) (03/12 0955) Pulse Rate:  [65] 65 (03/12 0955) Resp:  [17] 17 (03/12 0955) BP: (128)/(77) 128/77 (03/12 0955) SpO2:  [99 %] 99 % (03/12 0955) Weight:  [70.3 kg] 70.3 kg (03/12 0955)   General:   Alert,  Well-developed, well-nourished, pleasant and cooperative in NAD Head:  Normocephalic and atraumatic. Eyes:  Sclera clear, no icterus.   Conjunctiva pink. Ears:  Normal auditory acuity. Nose:  No deformity, discharge,  or lesions. Mouth:  No deformity or lesions, dentition normal. Neck:  Supple; no masses or thyromegaly. Lungs:  Clear throughout to auscultation.   No wheezes, crackles, or rhonchi. No acute  distress. Heart:  Regular rate and rhythm; no murmurs, clicks, rubs,  or gallops. Abdomen:  Soft, nontender and nondistended. No masses, hepatosplenomegaly or hernias noted. Normal bowel sounds, without guarding, and without rebound.   Msk:  Symmetrical without gross deformities. Normal posture. Extremities:  Without clubbing or edema. Neurologic:  Alert and  oriented x4;  grossly normal neurologically. Skin:  Intact without significant lesions or rashes. Cervical Nodes:  No significant cervical adenopathy. Psych:  Alert and cooperative. Normal mood and affect.  Impression/Plan: Wesley Middleton is here for a colonoscopy to be performed for high risk  colon cancer screening purposes, family history of colon cancer in mother  The risks of the procedure including infection, bleed, or perforation as well as benefits, limitations, alternatives and imponderables have been reviewed with the patient. Questions have been answered. All parties agreeable.

## 2022-11-01 NOTE — Transfer of Care (Signed)
Immediate Anesthesia Transfer of Care Note  Patient: Wesley Middleton  Procedure(s) Performed: COLONOSCOPY WITH PROPOFOL POLYPECTOMY  Patient Location: Endoscopy Unit  Anesthesia Type:General  Level of Consciousness: drowsy  Airway & Oxygen Therapy: Patient Spontanous Breathing  Post-op Assessment: Report given to RN and Post -op Vital signs reviewed and stable  Post vital signs: Reviewed and stable  Last Vitals:  Vitals Value Taken Time  BP 99/61 11/01/22 1157  Temp 36.4 C 11/01/22 1157  Pulse 55 11/01/22 1157  Resp 18 11/01/22 1157  SpO2 99 % 11/01/22 1157    Last Pain:  Vitals:   11/01/22 1157  TempSrc: Axillary  PainSc: 0-No pain      Patients Stated Pain Goal: 8 ([id number])  Complications: No notable events documented.

## 2022-11-03 LAB — SURGICAL PATHOLOGY

## 2022-11-10 ENCOUNTER — Ambulatory Visit: Payer: BC Managed Care – PPO | Admitting: Urology

## 2022-11-11 ENCOUNTER — Encounter (HOSPITAL_COMMUNITY): Payer: Self-pay | Admitting: Internal Medicine

## 2022-11-11 ENCOUNTER — Ambulatory Visit (INDEPENDENT_AMBULATORY_CARE_PROVIDER_SITE_OTHER): Payer: BC Managed Care – PPO | Admitting: Family Medicine

## 2022-11-11 VITALS — BP 120/68 | HR 59 | Temp 97.8°F | Ht 70.0 in | Wt 156.2 lb

## 2022-11-11 DIAGNOSIS — Z0000 Encounter for general adult medical examination without abnormal findings: Secondary | ICD-10-CM | POA: Diagnosis not present

## 2022-11-11 NOTE — Progress Notes (Signed)
Subjective:    Patient ID: Wesley Middleton, male    DOB: 09/27/1975, 47 y.o.   MRN: [id number]  HPI Patient is a very pleasant 47 year old Caucasian gentleman who is here today for complete physical exam.  He denies any major concerns.  He does report some lower urinary tract symptoms.  He states that he has increased urinary frequency, weaker urinary stream.  He does not feel like he is able to completely evacuate his bladder.  Unfortunately, the patient recently had a kidney stone.  He has seen urology and they performed a rectal exam and he was diagnosed with BPH.  He does not want to take Flomax at the present time but we did discuss saw palmetto.  He also believes that his mother may have had colon cancer.  He had a colonoscopy recently that revealed 2 benign polyps.  They recommended colonoscopy in 5 years because of family history.  Reviewing his immunization records, his tetanus shot is not due until 2029.  He is due for COVID booster as well as at flu shot if he wanted them.  We discussed the benefits and the risk.  The patient defers at the present time. Past Medical History:  Diagnosis Date   Kidney stones    Past Surgical History:  Procedure Laterality Date   COLONOSCOPY WITH PROPOFOL N/A 11/01/2022   Procedure: COLONOSCOPY WITH PROPOFOL;  Surgeon: Eloise Harman, DO;  Location: [facility];  Service: Endoscopy;  Laterality: N/A;  12:45 pm, asa 2   FOOT SURGERY Left    HEEL DECUBITUS ULCER EXCISION     KNEE ARTHROSCOPY Bilateral    POLYPECTOMY  11/01/2022   Procedure: POLYPECTOMY;  Surgeon: Eloise Harman, DO;  Location: [facility];  Service: Endoscopy;;   VARICOCELECTOMY     left testicle   No current outpatient medications on file prior to visit.   No current facility-administered medications on file prior to visit.   Allergies  Allergen Reactions   Ceclor [Cefaclor] Other (See Comments)    Pt had reaction as a child   Penicillins Other (See Comments)     Had reaction as child   Social History   Socioeconomic History   Marital status: Married    Spouse name: Not on file   Number of children: Not on file   Years of education: Not on file   Highest education level: Not on file  Occupational History   Not on file  Tobacco Use   Smoking status: Some Days    Types: Cigarettes   Smokeless tobacco: Current    Types: Snuff  Vaping Use   Vaping Use: Never used  Substance and Sexual Activity   Alcohol use: Yes    Alcohol/week: 0.0 standard drinks of alcohol    Comment: Occasionally   Drug use: No   Sexual activity: Not on file  Other Topics Concern   Not on file  Social History Narrative   Not on file   Social Determinants of Health   Financial Resource Strain: Not on file  Food Insecurity: Not on file  Transportation Needs: Not on file  Physical Activity: Not on file  Stress: Not on file  Social Connections: Not on file  Intimate Partner Violence: Not on file   Family History  Problem Relation Age of Onset   Cancer Mother        colon (57)   Stroke Paternal Grandfather       Review of Systems  Objective:   Physical Exam Vitals reviewed.  Constitutional:      General: He is not in acute distress.    Appearance: Normal appearance. He is normal weight. He is not ill-appearing, toxic-appearing or diaphoretic.  HENT:     Head: Normocephalic and atraumatic.     Right Ear: Tympanic membrane and ear canal normal. There is no impacted cerumen.     Left Ear: Tympanic membrane and ear canal normal. There is no impacted cerumen.     Nose: Nose normal. No congestion or rhinorrhea.     Mouth/Throat:     Mouth: Mucous membranes are moist.     Pharynx: Oropharynx is clear. No oropharyngeal exudate or posterior oropharyngeal erythema.  Eyes:     General: No scleral icterus.       Right eye: No discharge.        Left eye: No discharge.     Extraocular Movements: Extraocular movements intact.     Conjunctiva/sclera:  Conjunctivae normal.     Pupils: Pupils are equal, round, and reactive to light.  Neck:     Vascular: No carotid bruit.  Cardiovascular:     Rate and Rhythm: Normal rate and regular rhythm.     Pulses: Normal pulses.     Heart sounds: Normal heart sounds. No murmur heard.    No friction rub. No gallop.  Pulmonary:     Effort: Pulmonary effort is normal. No respiratory distress.     Breath sounds: Normal breath sounds. No stridor. No wheezing, rhonchi or rales.  Abdominal:     General: Abdomen is flat. Bowel sounds are normal. There is no distension.     Palpations: Abdomen is soft.     Tenderness: There is no abdominal tenderness. There is no guarding or rebound.     Hernia: No hernia is present.  Musculoskeletal:        General: No swelling, tenderness, deformity or signs of injury.     Cervical back: Normal range of motion and neck supple. No rigidity or tenderness.     Right lower leg: No edema.     Left lower leg: No edema.  Lymphadenopathy:     Cervical: No cervical adenopathy.  Skin:    General: Skin is warm.     Coloration: Skin is not jaundiced or pale.     Findings: No bruising, erythema, lesion or rash.  Neurological:     General: No focal deficit present.     Mental Status: He is alert and oriented to person, place, and time. Mental status is at baseline.     Cranial Nerves: No cranial nerve deficit.     Sensory: No sensory deficit.     Motor: No weakness.     Coordination: Coordination normal.     Gait: Gait normal.     Deep Tendon Reflexes: Reflexes normal.           Assessment & Plan:  General medical exam - Plan: CBC with Differential/Platelet, Lipid panel, COMPLETE METABOLIC PANEL WITH GFR Physical exam today is completely normal.  Colon cancer screening and prostate cancer screening are up-to-date.  I recommended a COVID booster.  The remainder of his immunizations are up-to-date.  Next colonoscopy is due in 2029.  I will check a CBC a CMP and a lipid  panel.

## 2022-11-24 LAB — CBC WITH DIFFERENTIAL/PLATELET
Absolute Monocytes: 365 cells/uL (ref 200–950)
Basophils Absolute: 40 cells/uL (ref 0–200)
Basophils Relative: 0.7 %
Eosinophils Absolute: 68 cells/uL (ref 15–500)
Eosinophils Relative: 1.2 %
HCT: 46.3 % (ref 38.5–50.0)
Hemoglobin: 14.5 g/dL (ref 13.2–17.1)
Lymphs Abs: 1704 cells/uL (ref 850–3900)
MCH: 30.3 pg (ref 27.0–33.0)
MCHC: 31.3 g/dL — ABNORMAL LOW (ref 32.0–36.0)
MCV: 96.9 fL (ref 80.0–100.0)
MPV: 14.1 fL — ABNORMAL HIGH (ref 7.5–12.5)
Monocytes Relative: 6.4 %
Neutro Abs: 3523 cells/uL (ref 1500–7800)
Neutrophils Relative %: 61.8 %
Platelets: 147 10*3/uL (ref 140–400)
RBC: 4.78 10*6/uL (ref 4.20–5.80)
RDW: 13 % (ref 11.0–15.0)
Total Lymphocyte: 29.9 %
WBC: 5.7 10*3/uL (ref 3.8–10.8)

## 2022-11-24 LAB — COMPLETE METABOLIC PANEL WITH GFR
AG Ratio: 1.7 (calc) (ref 1.0–2.5)
ALT: 17 U/L (ref 9–46)
AST: 17 U/L (ref 10–40)
Albumin: 4.3 g/dL (ref 3.6–5.1)
Alkaline phosphatase (APISO): 56 U/L (ref 36–130)
BUN: 14 mg/dL (ref 7–25)
CO2: 30 mmol/L (ref 20–32)
Calcium: 9.6 mg/dL (ref 8.6–10.3)
Chloride: 104 mmol/L (ref 98–110)
Creat: 0.99 mg/dL (ref 0.60–1.29)
Globulin: 2.5 g/dL (calc) (ref 1.9–3.7)
Glucose, Bld: 93 mg/dL (ref 65–99)
Potassium: 4.2 mmol/L (ref 3.5–5.3)
Sodium: 140 mmol/L (ref 135–146)
Total Bilirubin: 0.5 mg/dL (ref 0.2–1.2)
Total Protein: 6.8 g/dL (ref 6.1–8.1)
eGFR: 94 mL/min/{1.73_m2} (ref >=60)

## 2022-11-24 LAB — LIPID PANEL
Cholesterol: 138 mg/dL (ref <200)
HDL: 64 mg/dL (ref >=40)
LDL Cholesterol (Calc): 63 mg/dL (calc)
Non-HDL Cholesterol (Calc): 74 mg/dL (calc) (ref <130)
Total CHOL/HDL Ratio: 2.2 (calc) (ref <5.0)
Triglycerides: 39 mg/dL (ref <150)

## 2023-11-21 ENCOUNTER — Encounter: Admitting: Family Medicine

## 2023-11-27 ENCOUNTER — Ambulatory Visit: Admitting: Family Medicine

## 2023-11-27 VITALS — BP 122/64 | HR 55 | Temp 98.5°F | Ht 70.0 in | Wt 159.4 lb

## 2023-11-27 DIAGNOSIS — N400 Enlarged prostate without lower urinary tract symptoms: Secondary | ICD-10-CM | POA: Diagnosis not present

## 2023-11-27 DIAGNOSIS — Z0001 Encounter for general adult medical examination with abnormal findings: Secondary | ICD-10-CM | POA: Diagnosis not present

## 2023-11-27 DIAGNOSIS — Z0000 Encounter for general adult medical examination without abnormal findings: Secondary | ICD-10-CM

## 2023-11-27 NOTE — Progress Notes (Signed)
 Subjective:    Patient ID: Wesley Middleton, male    DOB: 1975/10/14, 48 y.o.   MRN: [id number]  HPI Patient is a very pleasant 48 year old Caucasian gentleman who is here today for complete physical exam.  He denies any major concerns.   He had a colonoscopy 3/24 that revealed 2 benign polyps.  They recommended colonoscopy in 5 years because of family history in his mother.  Reviewing his immunization records, his tetanus shot is not due until 2029.  Urology has diagnosed him with BPH after he was seen for kidney stones.  He does state that he has increased urinary frequency but otherwise is doing well. Past Medical History:  Diagnosis Date   Kidney stones    Past Surgical History:  Procedure Laterality Date   COLONOSCOPY WITH PROPOFOL N/A 11/01/2022   Procedure: COLONOSCOPY WITH PROPOFOL;  Surgeon: Lanelle Bal, DO;  Location: [facility];  Service: Endoscopy;  Laterality: N/A;  12:45 pm, asa 2   FOOT SURGERY Left    HEEL DECUBITUS ULCER EXCISION     KNEE ARTHROSCOPY Bilateral    POLYPECTOMY  11/01/2022   Procedure: POLYPECTOMY;  Surgeon: Lanelle Bal, DO;  Location: [facility];  Service: Endoscopy;;   VARICOCELECTOMY     left testicle   No current outpatient medications on file prior to visit.   No current facility-administered medications on file prior to visit.   Allergies  Allergen Reactions   Ceclor [Cefaclor] Other (See Comments)    Pt had reaction as a child   Penicillins Other (See Comments)    Had reaction as child   Social History   Socioeconomic History   Marital status: Married    Spouse name: Not on file   Number of children: Not on file   Years of education: Not on file   Highest education level: Not on file  Occupational History   Not on file  Tobacco Use   Smoking status: Some Days    Types: Cigarettes   Smokeless tobacco: Current    Types: Snuff  Vaping Use   Vaping status: Never Used  Substance and Sexual Activity   Alcohol  use: Yes    Alcohol/week: 0.0 standard drinks of alcohol    Comment: Occasionally   Drug use: No   Sexual activity: Not on file  Other Topics Concern   Not on file  Social History Narrative   Not on file   Social Drivers of Health   Financial Resource Strain: Not on file  Food Insecurity: Not on file  Transportation Needs: Not on file  Physical Activity: Not on file  Stress: Not on file  Social Connections: Not on file  Intimate Partner Violence: Not on file   Family History  Problem Relation Age of Onset   Cancer Mother        colon (4)   Stroke Paternal Grandfather       Review of Systems     Objective:   Physical Exam Vitals reviewed.  Constitutional:      General: He is not in acute distress.    Appearance: Normal appearance. He is normal weight. He is not ill-appearing, toxic-appearing or diaphoretic.  HENT:     Head: Normocephalic and atraumatic.     Right Ear: Tympanic membrane and ear canal normal. There is no impacted cerumen.     Left Ear: Tympanic membrane and ear canal normal. There is no impacted cerumen.     Nose: Nose normal. No  congestion or rhinorrhea.     Mouth/Throat:     Mouth: Mucous membranes are moist.     Pharynx: Oropharynx is clear. No oropharyngeal exudate or posterior oropharyngeal erythema.  Eyes:     General: No scleral icterus.       Right eye: No discharge.        Left eye: No discharge.     Extraocular Movements: Extraocular movements intact.     Conjunctiva/sclera: Conjunctivae normal.     Pupils: Pupils are equal, round, and reactive to light.  Neck:     Vascular: No carotid bruit.  Cardiovascular:     Rate and Rhythm: Normal rate and regular rhythm.     Pulses: Normal pulses.     Heart sounds: Normal heart sounds. No murmur heard.    No friction rub. No gallop.  Pulmonary:     Effort: Pulmonary effort is normal. No respiratory distress.     Breath sounds: Normal breath sounds. No stridor. No wheezing, rhonchi or rales.   Abdominal:     General: Abdomen is flat. Bowel sounds are normal. There is no distension.     Palpations: Abdomen is soft.     Tenderness: There is no abdominal tenderness. There is no guarding or rebound.     Hernia: No hernia is present.  Musculoskeletal:        General: No swelling, tenderness, deformity or signs of injury.     Cervical back: Normal range of motion and neck supple. No rigidity or tenderness.     Right lower leg: No edema.     Left lower leg: No edema.  Lymphadenopathy:     Cervical: No cervical adenopathy.  Skin:    General: Skin is warm.     Coloration: Skin is not jaundiced or pale.     Findings: No bruising, erythema, lesion or rash.  Neurological:     General: No focal deficit present.     Mental Status: He is alert and oriented to person, place, and time. Mental status is at baseline.     Cranial Nerves: No cranial nerve deficit.     Sensory: No sensory deficit.     Motor: No weakness.     Coordination: Coordination normal.     Gait: Gait normal.     Deep Tendon Reflexes: Reflexes normal.           Assessment & Plan:  General medical exam - Plan: CBC with Differential/Platelet, COMPLETE METABOLIC PANEL WITHOUT GFR, Lipid panel  Benign prostatic hyperplasia without lower urinary tract symptoms - Plan: PSA  Physical exam today is completely normal.  Check CBC, CMP, fasting lipid panel.  Blood pressure is outstanding today at 122/64.  Colon cancer screening is not due again until 2029.  Will screen for prostate cancer given his history of BPH.  Tetanus shot is up-to-date.  We did discuss the pneumonia vaccine and shingles shot for after he turns 48.

## 2023-11-28 LAB — COMPLETE METABOLIC PANEL WITHOUT GFR
AG Ratio: 1.9 (calc) (ref 1.0–2.5)
ALT: 15 U/L (ref 9–46)
AST: 17 U/L (ref 10–40)
Albumin: 4.6 g/dL (ref 3.6–5.1)
Alkaline phosphatase (APISO): 61 U/L (ref 36–130)
BUN: 15 mg/dL (ref 7–25)
CO2: 28 mmol/L (ref 20–32)
Calcium: 9.6 mg/dL (ref 8.6–10.3)
Chloride: 102 mmol/L (ref 98–110)
Creat: 0.96 mg/dL (ref 0.60–1.29)
Globulin: 2.4 g/dL (ref 1.9–3.7)
Glucose, Bld: 93 mg/dL (ref 65–99)
Potassium: 4.4 mmol/L (ref 3.5–5.3)
Sodium: 139 mmol/L (ref 135–146)
Total Bilirubin: 0.4 mg/dL (ref 0.2–1.2)
Total Protein: 7 g/dL (ref 6.1–8.1)

## 2023-11-28 LAB — LIPID PANEL
Cholesterol: 144 mg/dL (ref <200)
HDL: 57 mg/dL (ref >=40)
LDL Cholesterol (Calc): 74 mg/dL
Non-HDL Cholesterol (Calc): 87 mg/dL (ref <130)
Total CHOL/HDL Ratio: 2.5 (calc) (ref <5.0)
Triglycerides: 45 mg/dL (ref <150)

## 2023-11-28 LAB — CBC WITH DIFFERENTIAL/PLATELET
Absolute Lymphocytes: 2252 {cells}/uL (ref 850–3900)
Absolute Monocytes: 537 {cells}/uL (ref 200–950)
Basophils Absolute: 63 {cells}/uL (ref 0–200)
Basophils Relative: 0.8 %
Eosinophils Absolute: 47 {cells}/uL (ref 15–500)
Eosinophils Relative: 0.6 %
HCT: 44.1 % (ref 38.5–50.0)
Hemoglobin: 14.6 g/dL (ref 13.2–17.1)
MCH: 31.3 pg (ref 27.0–33.0)
MCHC: 33.1 g/dL (ref 32.0–36.0)
MCV: 94.4 fL (ref 80.0–100.0)
MPV: 14.1 fL — ABNORMAL HIGH (ref 7.5–12.5)
Monocytes Relative: 6.8 %
Neutro Abs: 5001 {cells}/uL (ref 1500–7800)
Neutrophils Relative %: 63.3 %
Platelets: 147 10*3/uL (ref 140–400)
RBC: 4.67 10*6/uL (ref 4.20–5.80)
RDW: 12.3 % (ref 11.0–15.0)
Total Lymphocyte: 28.5 %
WBC: 7.9 10*3/uL (ref 3.8–10.8)

## 2023-11-28 LAB — PSA: PSA: 1.28 ng/mL (ref <=4.00)
